# Patient Record
Sex: Female | Born: 1940
Health system: Southern US, Community
[De-identification: ages and names within clinical notes are randomized; demographics above are authoritative.]

## PROBLEM LIST (undated history)

## (undated) DIAGNOSIS — Z789 Other specified health status: Secondary | ICD-10-CM

## (undated) DIAGNOSIS — C4490 Unspecified malignant neoplasm of skin, unspecified: Secondary | ICD-10-CM

## (undated) DIAGNOSIS — J869 Pyothorax without fistula: Secondary | ICD-10-CM

## (undated) HISTORY — PX: NO PAST SURGERIES: SHX2092

## (undated) HISTORY — PX: BRONCHOSCOPY: SUR163

---

## 2003-06-26 ENCOUNTER — Inpatient Hospital Stay (HOSPITAL_COMMUNITY): Admission: EM | Admit: 2003-06-26 | Discharge: 2003-06-30 | Payer: Self-pay | Admitting: *Deleted

## 2003-09-02 ENCOUNTER — Encounter: Admission: RE | Admit: 2003-09-02 | Discharge: 2003-10-22 | Payer: Self-pay | Admitting: Orthopedic Surgery

## 2004-07-31 DIAGNOSIS — J869 Pyothorax without fistula: Secondary | ICD-10-CM

## 2004-07-31 HISTORY — DX: Pyothorax without fistula: J86.9

## 2004-07-31 HISTORY — PX: CHEST TUBE INSERTION: SHX231

## 2004-11-01 ENCOUNTER — Emergency Department (HOSPITAL_COMMUNITY): Admission: EM | Admit: 2004-11-01 | Discharge: 2004-11-01 | Payer: Self-pay | Admitting: Emergency Medicine

## 2005-06-21 ENCOUNTER — Inpatient Hospital Stay (HOSPITAL_COMMUNITY): Admission: EM | Admit: 2005-06-21 | Discharge: 2005-07-27 | Payer: Self-pay | Admitting: Emergency Medicine

## 2005-06-21 ENCOUNTER — Ambulatory Visit: Payer: Self-pay | Admitting: Internal Medicine

## 2005-06-21 ENCOUNTER — Ambulatory Visit: Payer: Self-pay | Admitting: Physical Medicine & Rehabilitation

## 2005-06-23 ENCOUNTER — Ambulatory Visit: Payer: Self-pay | Admitting: Pulmonary Disease

## 2005-07-02 ENCOUNTER — Ambulatory Visit: Payer: Self-pay | Admitting: Pulmonary Disease

## 2005-07-27 ENCOUNTER — Inpatient Hospital Stay
Admission: RE | Admit: 2005-07-27 | Discharge: 2005-08-04 | Payer: Self-pay | Admitting: Physical Medicine & Rehabilitation

## 2005-07-27 ENCOUNTER — Ambulatory Visit: Payer: Self-pay | Admitting: Physical Medicine & Rehabilitation

## 2005-08-16 ENCOUNTER — Encounter: Admission: RE | Admit: 2005-08-16 | Discharge: 2005-08-16 | Payer: Self-pay | Admitting: Thoracic Surgery

## 2005-08-29 ENCOUNTER — Ambulatory Visit: Payer: Self-pay | Admitting: Internal Medicine

## 2005-09-06 ENCOUNTER — Encounter: Admission: RE | Admit: 2005-09-06 | Discharge: 2005-09-06 | Payer: Self-pay | Admitting: Thoracic Surgery

## 2005-10-23 ENCOUNTER — Ambulatory Visit: Payer: Self-pay | Admitting: Internal Medicine

## 2005-11-22 ENCOUNTER — Encounter: Admission: RE | Admit: 2005-11-22 | Discharge: 2005-11-22 | Payer: Self-pay | Admitting: Thoracic Surgery

## 2006-01-18 IMAGING — CR DG CHEST 2V
3 series · 3 of 3 positions shown · non-contrast
Comparison: none

CLINICAL DATA: Pneumonia

[view not recorded (1 of 3)]
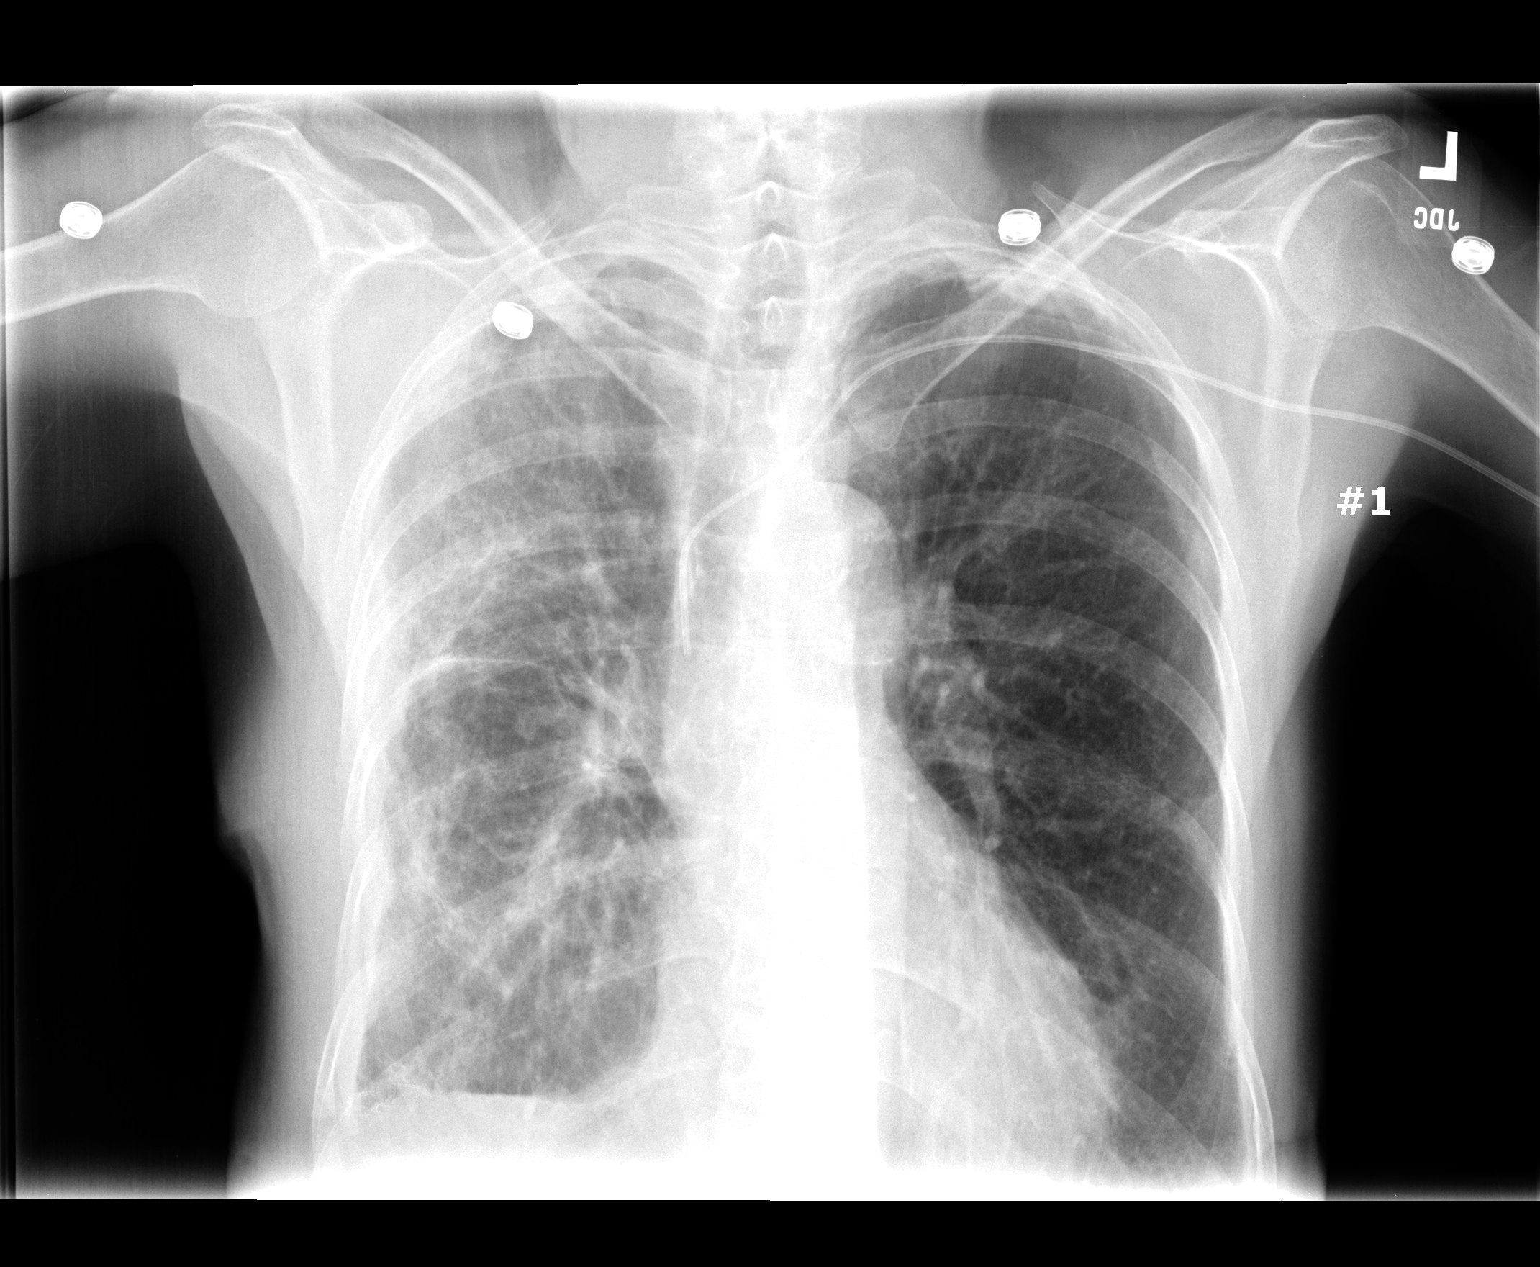

[view not recorded (2 of 3)]
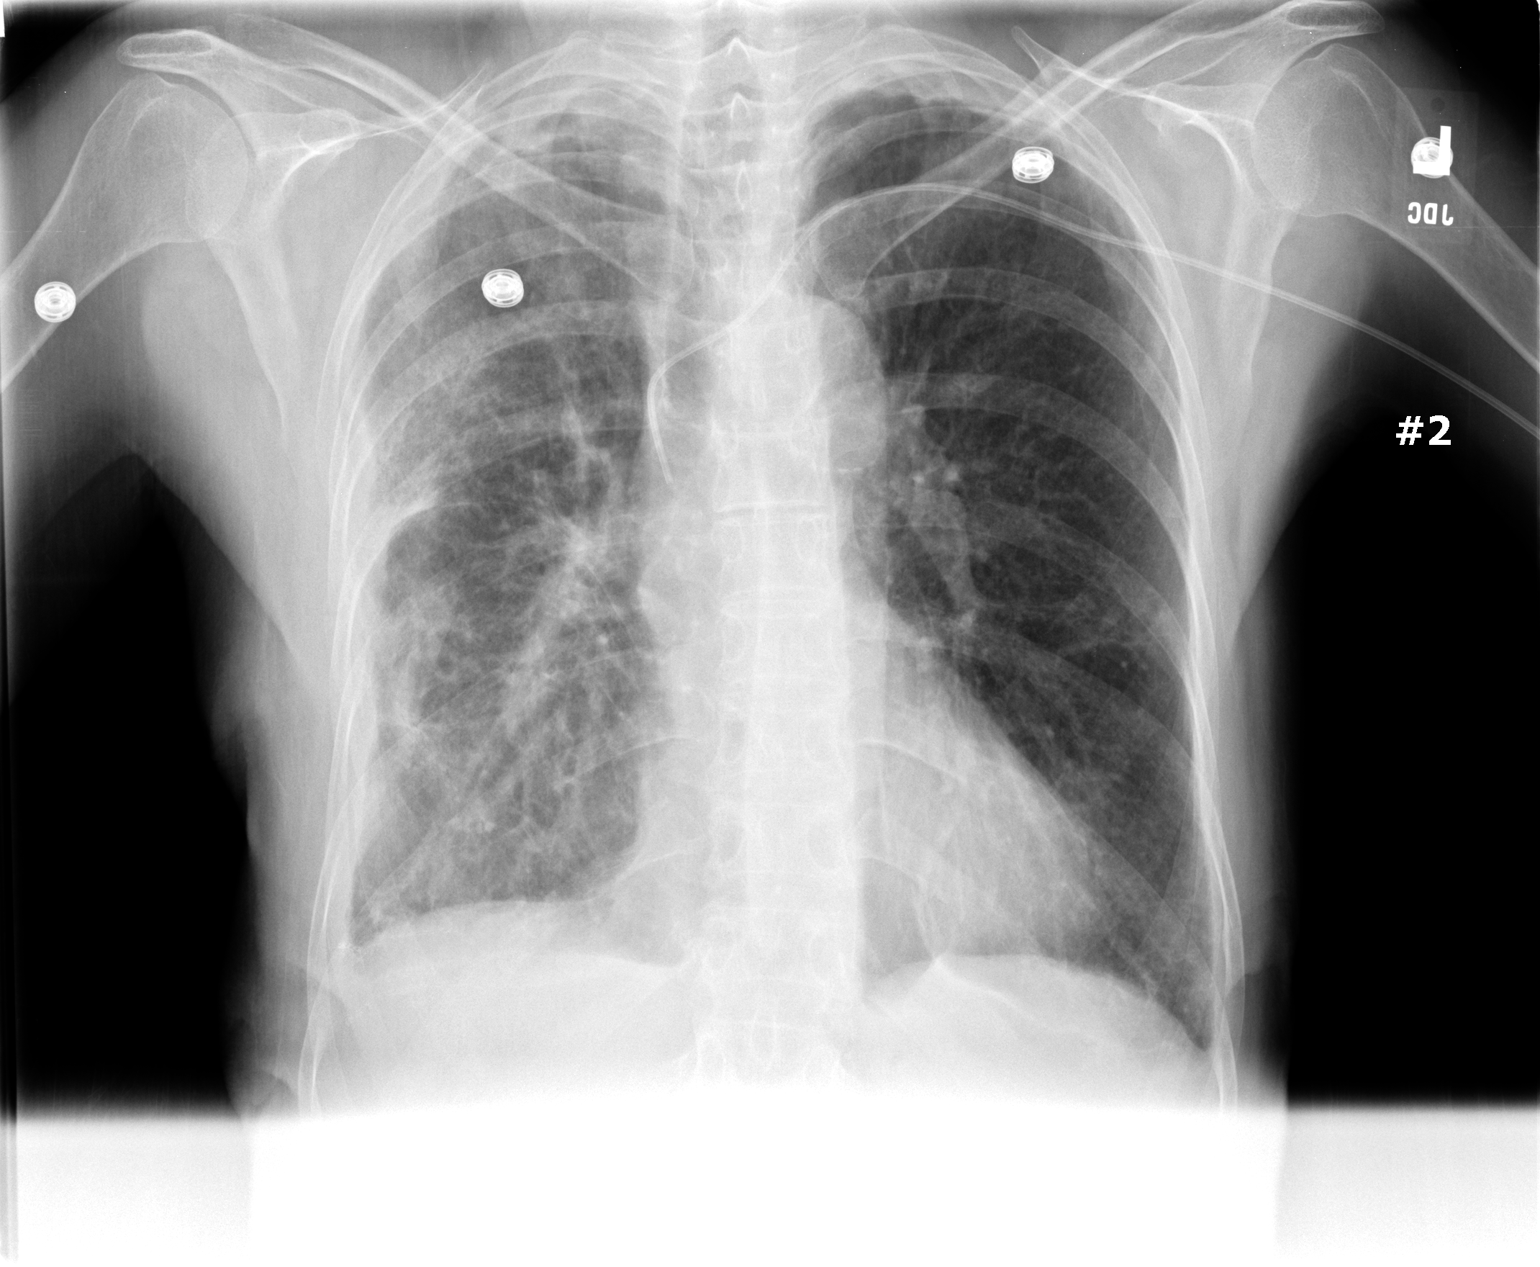

[view not recorded (3 of 3)]
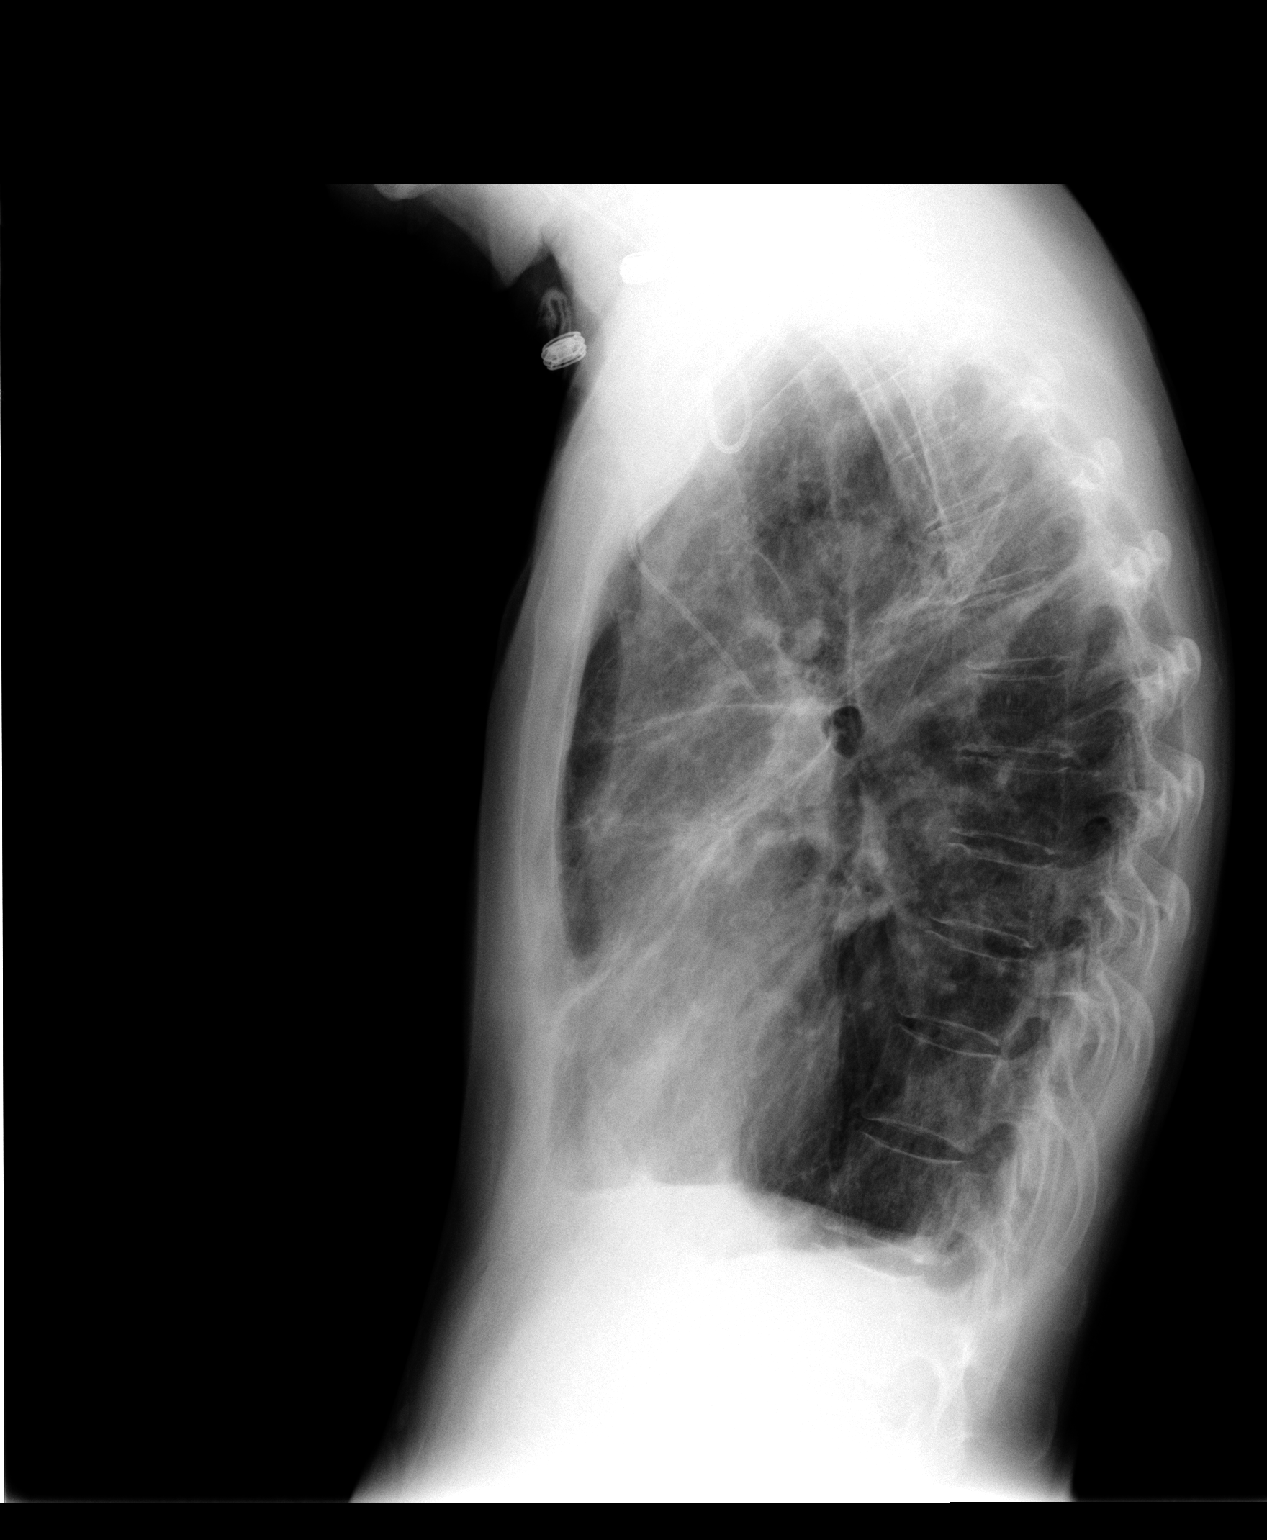

[3 of 3 positions shown; findings below may reference images not displayed]

Chest 2 view:

Comparison 07/26/2005. Right arm PICC line to SVC as before. Some marginal
increase in coarse air space opacities in the right upper lobe and right middle
lobe. Left lung hyperinflated with coarse bibasilar interstitial markings
stable. No definite effusion. No pneumothorax is evident. Heart size upper
limits normal.
IMPRESSION: 1. Marginal worsening of coarse  right upper and middle lobe infiltrates.

## 2006-02-03 IMAGING — CR DG CHEST 2V
2 series · 2 of 2 positions shown · non-contrast
Comparison: 07/31/05.

CLINICAL DATA: Post right lung surgery.
 CHEST, TWO VIEWS:

[view not recorded (1 of 2)]
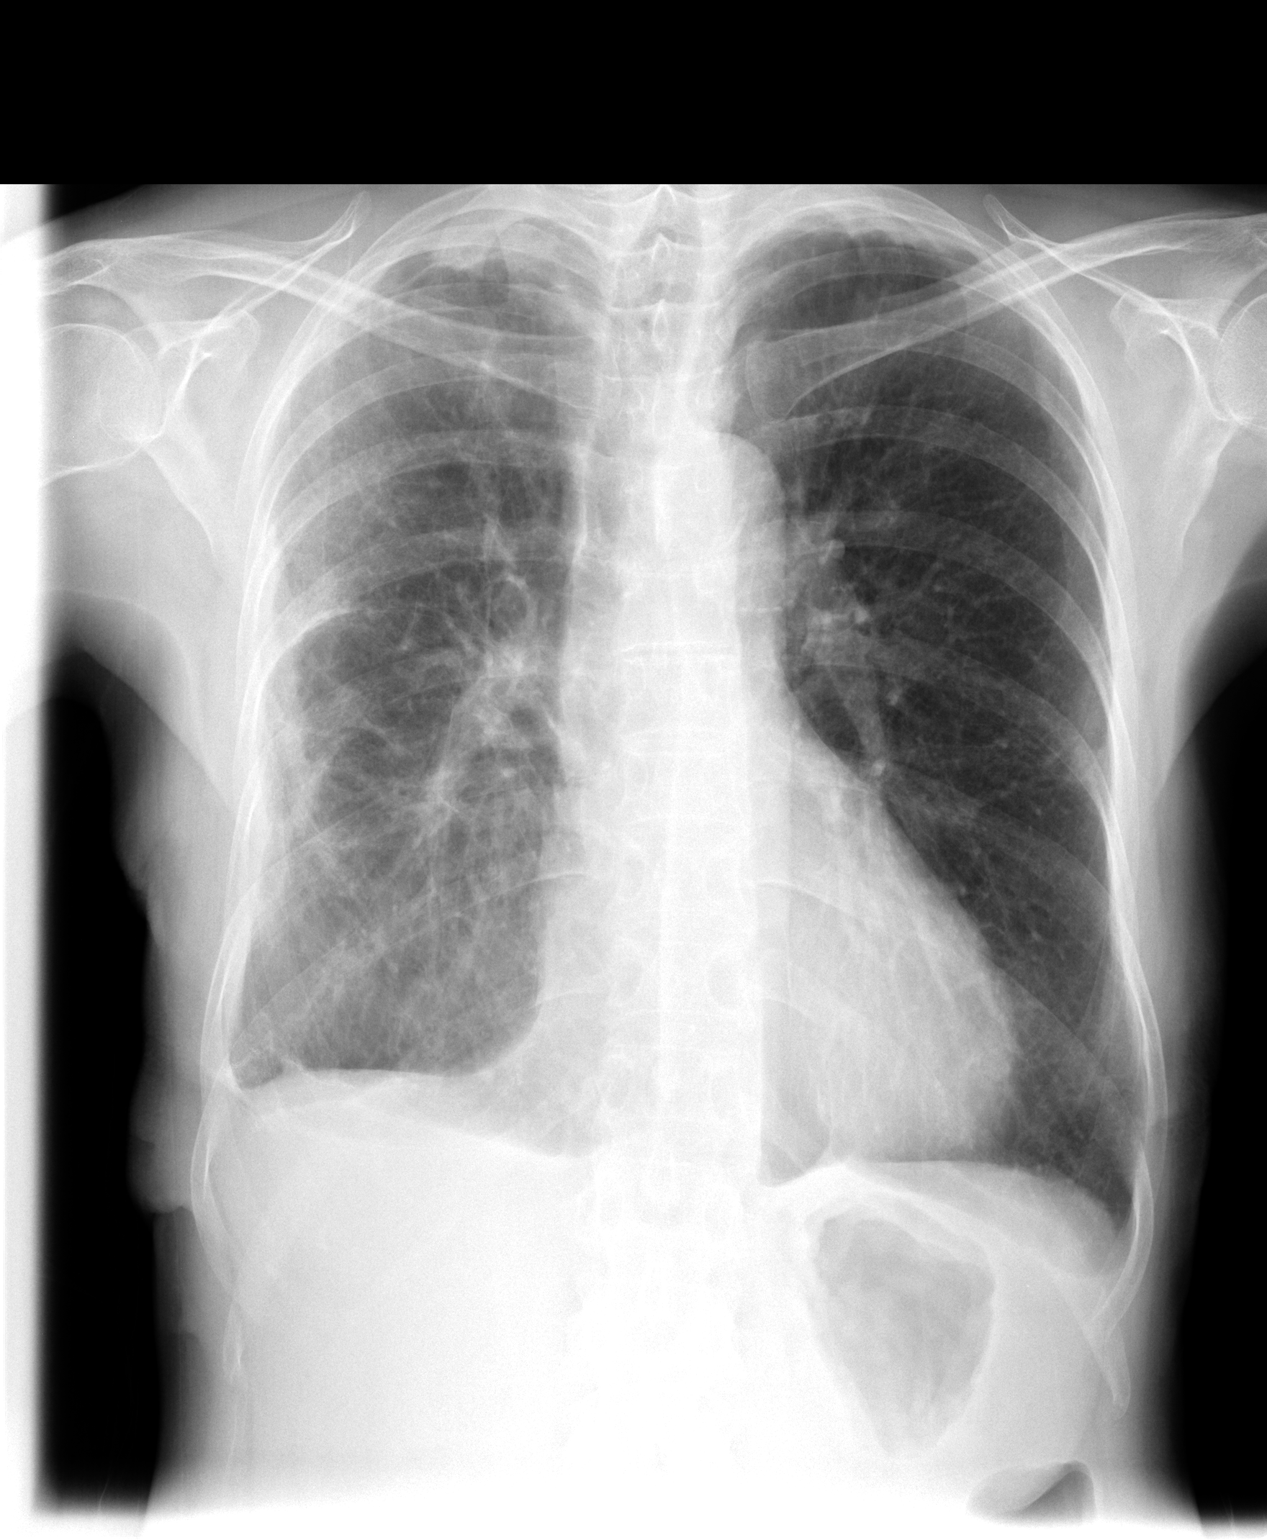

[view not recorded (2 of 2)]
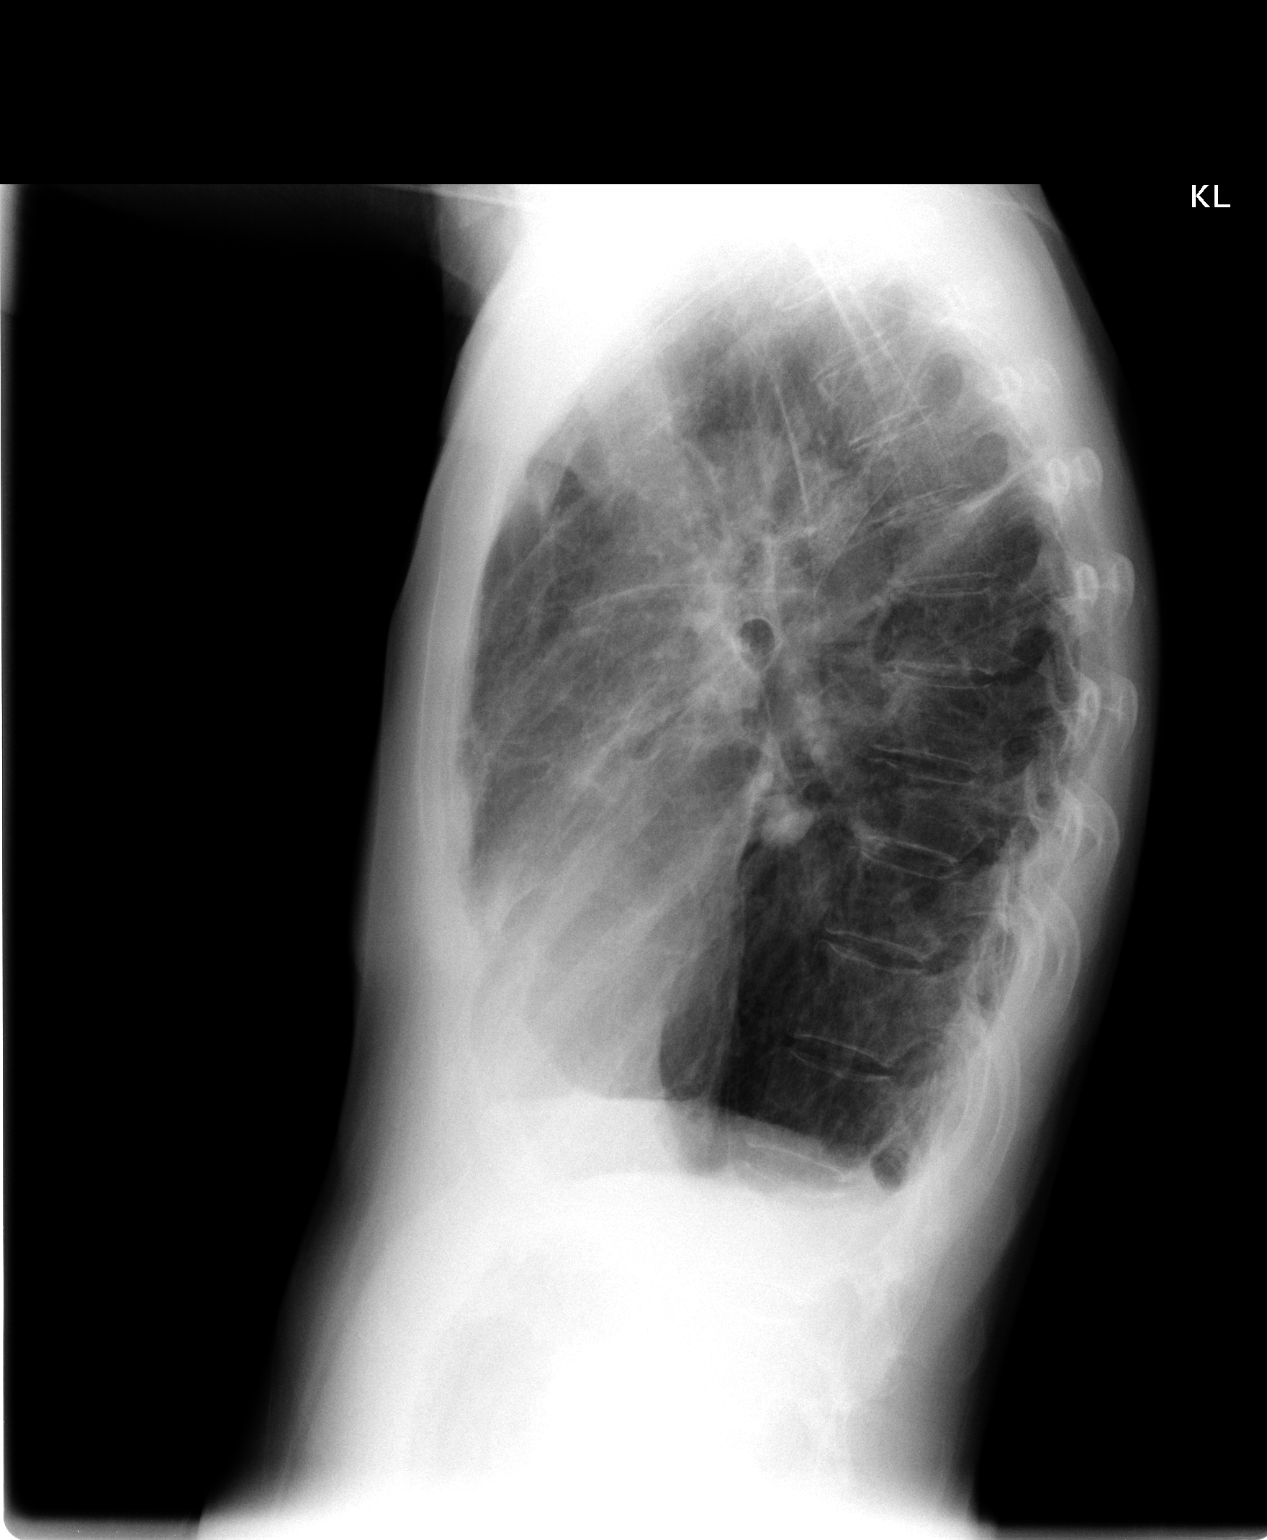

[2 of 2 positions shown; findings below may reference images not displayed]

Right lung aeration improved.  There is still some residual density which may well be chronic.  COPD with small pleural effusions or pleural scarring and rather heavy apical pleural scarring.  Left upper extremity PICC has been removed.
IMPRESSION: 1.  Right lung infiltrates, improved significantly.
 2.  COPD.

## 2006-05-01 DIAGNOSIS — I10 Essential (primary) hypertension: Secondary | ICD-10-CM | POA: Insufficient documentation

## 2006-07-17 DIAGNOSIS — J869 Pyothorax without fistula: Secondary | ICD-10-CM | POA: Insufficient documentation

## 2010-08-21 ENCOUNTER — Encounter: Payer: Self-pay | Admitting: Thoracic Surgery

## 2011-11-06 ENCOUNTER — Encounter (HOSPITAL_COMMUNITY): Payer: Self-pay | Admitting: Emergency Medicine

## 2011-11-06 ENCOUNTER — Emergency Department (HOSPITAL_COMMUNITY): Payer: Medicare PPO

## 2011-11-06 ENCOUNTER — Emergency Department (HOSPITAL_COMMUNITY)
Admission: EM | Admit: 2011-11-06 | Discharge: 2011-11-06 | Disposition: A | Discharge status: Home / Self Care | Payer: Medicare PPO | Attending: Emergency Medicine | Admitting: Emergency Medicine

## 2011-11-06 DIAGNOSIS — N979 Female infertility, unspecified: Secondary | ICD-10-CM | POA: Insufficient documentation

## 2011-11-06 DIAGNOSIS — Z808 Family history of malignant neoplasm of other organs or systems: Secondary | ICD-10-CM | POA: Insufficient documentation

## 2011-11-06 DIAGNOSIS — R079 Chest pain, unspecified: Secondary | ICD-10-CM | POA: Insufficient documentation

## 2011-11-06 DIAGNOSIS — L989 Disorder of the skin and subcutaneous tissue, unspecified: Secondary | ICD-10-CM | POA: Insufficient documentation

## 2011-11-06 DIAGNOSIS — R059 Cough, unspecified: Secondary | ICD-10-CM | POA: Insufficient documentation

## 2011-11-06 DIAGNOSIS — L0390 Cellulitis, unspecified: Secondary | ICD-10-CM

## 2011-11-06 DIAGNOSIS — R05 Cough: Secondary | ICD-10-CM | POA: Insufficient documentation

## 2011-11-06 LAB — COMPREHENSIVE METABOLIC PANEL
ALT: 14 U/L (ref 0–35)
AST: 20 U/L (ref 0–37)
Albumin: 4.2 g/dL (ref 3.5–5.2)
Alkaline Phosphatase: 93 U/L (ref 39–117)
BUN: 9 mg/dL (ref 6–23)
CO2: 28 mEq/L (ref 19–32)
Calcium: 9.7 mg/dL (ref 8.4–10.5)
Creatinine, Ser: 0.7 mg/dL (ref 0.50–1.10)
GFR calc Af Amer: >90 mL/min (ref >90)
GFR calc non Af Amer: 85 mL/min — ABNORMAL LOW (ref >90)
Potassium: 3.5 mEq/L (ref 3.5–5.1)
Sodium: 139 mEq/L (ref 135–145)
Total Bilirubin: 0.4 mg/dL (ref 0.3–1.2)

## 2011-11-06 LAB — CBC
HCT: 43.8 % (ref 36.0–46.0)
Hemoglobin: 14.3 g/dL (ref 12.0–15.0)
MCH: 30.8 pg (ref 26.0–34.0)
MCHC: 32.6 g/dL (ref 30.0–36.0)
MCV: 94.2 fL (ref 78.0–100.0)
Platelets: 238 10*3/uL (ref 150–400)
RBC: 4.65 MIL/uL (ref 3.87–5.11)
WBC: 8.2 10*3/uL (ref 4.0–10.5)

## 2011-11-06 LAB — DIFFERENTIAL
Basophils Absolute: 0 10*3/uL (ref 0.0–0.1)
Eosinophils Absolute: 0.1 10*3/uL (ref 0.0–0.7)
Eosinophils Relative: 1 % (ref 0–5)
Lymphocytes Relative: 19 % (ref 12–46)
Lymphs Abs: 1.6 10*3/uL (ref 0.7–4.0)
Monocytes Absolute: 0.5 10*3/uL (ref 0.1–1.0)
Monocytes Relative: 6 % (ref 3–12)
Neutro Abs: 6.1 10*3/uL (ref 1.7–7.7)
Neutrophils Relative %: 74 % (ref 43–77)

## 2011-11-06 MED ORDER — IOHEXOL 300 MG/ML  SOLN
80.0000 mL | Freq: Once | INTRAMUSCULAR | Status: AC | PRN
  Administered 2011-11-06: 80 mL via INTRAVENOUS

## 2011-11-06 MED ORDER — SODIUM CHLORIDE 0.9 % IV SOLN: Freq: Once | INTRAVENOUS | Status: DC

## 2011-11-06 MED ORDER — DOXYCYCLINE HYCLATE 100 MG PO CAPS: 100.0000 mg | ORAL_CAPSULE | Freq: Two times a day (BID) | ORAL | Status: AC

## 2011-11-06 MED ORDER — DIPHENHYDRAMINE HCL 25 MG PO CAPS
25.0000 mg | ORAL_CAPSULE | Freq: Once | ORAL | Status: AC
  Administered 2011-11-06: 25 mg via ORAL
  Filled 2011-11-06: qty 1

## 2011-11-06 MED ORDER — BACITRACIN ZINC 500 UNIT/GM EX OINT
TOPICAL_OINTMENT | CUTANEOUS | Status: AC
  Filled 2011-11-06: qty 1.8

## 2011-11-06 NOTE — ED Notes (Signed)
Two sisters and mother have hx of skin cancer

## 2011-11-06 NOTE — ED Notes (Signed)
To ed via private vehicle with c/o two open wounds that she has had for months, not good historian. Has not seen a doctor since 2006.

## 2011-11-06 NOTE — Discharge Instructions (Signed)
Call dr. Elease Etienne office today to schedule a follow up appointment--you may have skin cancer and its very important that you are seen by him or a dermatologist  Cellulitis Cellulitis is an infection of the skin and the tissue beneath it. The area is typically red and tender. It is caused by germs (bacteria) (usually staph or strep) that enter the body through cuts or sores. Cellulitis most commonly occurs in the arms or lower legs.  HOME CARE INSTRUCTIONS   If you are given a prescription for medications which kill germs (antibiotics), take as directed until finished.   If the infection is on the arm or leg, keep the limb elevated as able.   Use a warm cloth several times per day to relieve pain and encourage healing.   See your caregiver for recheck of the infected site as directed if problems arise.   Only take over-the-counter or prescription medicines for pain, discomfort, or fever as directed by your caregiver.  SEEK MEDICAL CARE IF:   The area of redness (inflammation) is spreading, there are red streaks coming from the infected site, or if a part of the infection begins to turn dark in color.   The joint or bone underneath the infected skin becomes painful after the skin has healed.   The infection returns in the same or another area after it seems to have gone away.   A boil or bump swells up. This may be an abscess.   New, unexplained problems such as pain or fever develop.  SEEK IMMEDIATE MEDICAL CARE IF:   You have a fever.   You or your child feels drowsy or lethargic.   There is vomiting, diarrhea, or lasting discomfort or feeling ill (malaise) with muscle aches and pains.  MAKE SURE YOU:   Understand these instructions.   Will watch your condition.   Will get help right away if you are not doing well or get worse.  Document Released: 04/26/2005 Document Revised: 07/06/2011 Document Reviewed: 03/04/2008 [facility] Patient Information 2012 [place], [place].

## 2011-11-06 NOTE — ED Provider Notes (Signed)
History     CSN: [id number]  Arrival date & time 11/06/11  0905   First MD Initiated Contact with Patient 11/06/11 [phone]      Chief Complaint  Patient presents with  . possible skin cancer     (Consider location/radiation/quality/duration/timing/severity/associated sxs/prior treatment) The history is provided by the patient and a relative.   Patient here with skin lesion to right arm and right posterior thoracic area. Lesions have been there for an unknown amount of time possibly at least 6 months. Denies any fever or chills. Patient notes that she does expose himself to the sun quite often. Denies any history of skin cancer. No recent history of weight loss or night sweats. No medications taken for this prior to arrival. Has not seen a doctor for this No past medical history on file.  No past surgical history on file.  No family history on file.  History  Substance Use Topics  . Smoking status: Not on file  . Smokeless tobacco: Not on file  . Alcohol Use: Not on file    OB History    No data available      Review of Systems  All other systems reviewed and are negative.    Allergies  Review of patient's allergies indicates not on file.  Home Medications  No current outpatient prescriptions on file.  BP 161/72  Pulse 72  Temp(Src) 97.8 F (36.6 C) (Oral)  Resp 16  Wt 103 lb (46.72 kg)  SpO2 99%  Physical Exam  Nursing note and vitals reviewed. Constitutional: She is oriented to person, place, and time. She appears well-developed and well-nourished.  Non-toxic appearance. No distress.  HENT:  Head: Normocephalic and atraumatic.  Eyes: Conjunctivae, EOM and lids are normal. Pupils are equal, round, and reactive to light.  Neck: Normal range of motion. Neck supple. No tracheal deviation present. No mass present.  Cardiovascular: Normal rate, regular rhythm and normal heart sounds.  Exam reveals no gallop.   No murmur heard. Pulmonary/Chest: Effort normal and  breath sounds normal. No stridor. No respiratory distress. She has no decreased breath sounds. She has no wheezes. She has no rhonchi. She has no rales.  Abdominal: Soft. Normal appearance and bowel sounds are normal. She exhibits no distension. There is no tenderness. There is no rebound and no CVA tenderness.  Musculoskeletal: Normal range of motion. She exhibits no edema and no tenderness.  Neurological: She is alert and oriented to person, place, and time. She has normal strength. No cranial nerve deficit or sensory deficit. GCS eye subscore is 4. GCS verbal subscore is 5. GCS motor subscore is 6.  Skin: Skin is warm and dry. Lesion noted.       Right arm with medium sized raised lesion with curled edges and central ulceration slight erythema  Right posterior thoracic area: medium sized lesion on back with central ulceration  Psychiatric: She has a normal mood and affect. Her speech is normal and behavior is normal.    ED Course  Procedures (including critical care time)   Labs Reviewed  COMPREHENSIVE METABOLIC PANEL  CBC  DIFFERENTIAL   No results found.   No diagnosis found.    MDM  I attempted to contact Dr. Terri Piedra, dermatology, but he was unavailable. Patient will be treated for secondary cellulitis. The importance of close followup with a dermatologist was explained to her as she has a family history of skin cancer with 2 sisters. She understands this and will followup with Dr. Terri Piedra  Toy Baker, MD 11/06/11 1245

## 2011-11-14 ENCOUNTER — Other Ambulatory Visit: Payer: Self-pay | Admitting: Surgery

## 2012-01-22 ENCOUNTER — Encounter (HOSPITAL_BASED_OUTPATIENT_CLINIC_OR_DEPARTMENT_OTHER): Payer: Medicare PPO | Attending: General Surgery

## 2012-01-22 DIAGNOSIS — Y838 Other surgical procedures as the cause of abnormal reaction of the patient, or of later complication, without mention of misadventure at the time of the procedure: Secondary | ICD-10-CM | POA: Insufficient documentation

## 2012-01-22 DIAGNOSIS — S21209A Unspecified open wound of unspecified back wall of thorax without penetration into thoracic cavity, initial encounter: Secondary | ICD-10-CM | POA: Insufficient documentation

## 2012-01-22 DIAGNOSIS — C768 Malignant neoplasm of other specified ill-defined sites: Secondary | ICD-10-CM | POA: Insufficient documentation

## 2012-01-22 NOTE — Progress Notes (Signed)
[facility]  NAME:  Pamela Owens, Pamela Owens                 ACCOUNT NO.:  [id number]  MEDICAL RECORD NO.:  [id number]      DATE OF BIRTH:  1940-10-16  PHYSICIAN:  Wayland Denis, DO       VISIT DATE:  01/22/2012                                  OFFICE VISIT   CHIEF COMPLAINT:  Wound on the back.  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  She underwent excision by Mohs technique with negative margins obtained. She now has a new wound, that is 12.5 x 6.4 x 2 cm in size on the right side of her back.  A portion of it was closed by the Mohs surgeon.  She has a history of skin cancer and has another one on her arm that is going to be excised in the next few days.  She has been using wet-to-dry dressings on the area.  No significant change has been noted over the last several days.  Her primary caregiver is her son.  He seems very interactive with her and competent to do her dressing changes.  She had been dealing with this area for many years and did not seek help until recently.  REVIEW OF SYSTEMS:  Otherwise negative.  MEDICATIONS:  Vicodin for pain.  ALLERGIES:  No known drug allergies.  MEDICAL HISTORY:  She has no chronic medical conditions, and denies any diabetes, high blood pressure, or high cholesterol.  SURGERY:  Skin cancer excision of a basaloid squamous-cell on her back.  SOCIAL HISTORY:  The patient lives at home.  She used to smoke, but no longer does, and she is a Fitzpatrick 1.  PHYSICAL EXAMINATION:  GENERAL:  She is alert, oriented, cooperative, not in any acute distress.  She is pleasant. HEENT:  Pupils are equal.  Extraocular muscles are intact. NECK:  No cervical lymphadenopathy. LUNGS:  Breathing is unlabored. HEART:  Regular. ABDOMEN:  Soft. SKIN:  She has a gauze over her right shoulder upper arm area with an obvious skin cancer that will be  excised this week and on the back as described above.  PLAN:  Placing collagen on the area now and we will get her worked up for the operating room for irrigation and debridement, ACell placement in the back.  She and her son are in agreement for this treatment plan, which would then be followed by a skin graft once the area is granulating well.     Wayland Denis, DO     CS/MEDQ  D:  01/22/2012  T:  01/22/2012  Job:  [id number]

## 2012-01-29 ENCOUNTER — Encounter (HOSPITAL_BASED_OUTPATIENT_CLINIC_OR_DEPARTMENT_OTHER): Payer: Medicare PPO | Attending: General Surgery

## 2012-01-29 DIAGNOSIS — L98499 Non-pressure chronic ulcer of skin of other sites with unspecified severity: Secondary | ICD-10-CM | POA: Insufficient documentation

## 2012-01-30 NOTE — Progress Notes (Signed)
[facility]  NAME:  Pamela Owens, Pamela Owens                 ACCOUNT NO.:  [id number]  MEDICAL RECORD NO.:  [id number]      DATE OF BIRTH:  [birth year]  PHYSICIAN:  Wayland Denis, DO       VISIT DATE:  01/29/2012                                  OFFICE VISIT   The patient is a 71 year old female here with her son for followup on her back ulcer secondary to excision of a skin cancer.  She also has stitches in her right arm that were placed after excision of a skin cancer by the Mohs surgeon.  The arm looks quite a bit red and swollen. The back is actually improving with the collagen.  The back was a basal cell carcinoma that was excised.  Her medications are unchanged and her social history is unchanged.  She still has good support from her son.  On exam, she is alert, pleasant, oriented, and cooperative, not in any acute distress.  Pupils are equal.  Extraocular muscles are intact.  No cervical lymphadenopathy.  Her breathing is unlabored.  Her heart is regular.  Her abdomen is  soft.  The wound is slightly improved in the back, starting to fill and no sign of infection or drainage.  We will give her doxycycline and schedule her for surgery for A-Cell and VAC placement and then hopefully will be able to follow up with a skin graft.     Wayland Denis, DO     CS/MEDQ  D:  01/29/2012  T:  01/30/2012  Job:  [id number]

## 2012-02-05 ENCOUNTER — Other Ambulatory Visit: Payer: Self-pay | Admitting: Plastic Surgery

## 2012-02-05 ENCOUNTER — Encounter (HOSPITAL_BASED_OUTPATIENT_CLINIC_OR_DEPARTMENT_OTHER): Payer: Self-pay | Admitting: *Deleted

## 2012-02-05 DIAGNOSIS — L98429 Non-pressure chronic ulcer of back with unspecified severity: Secondary | ICD-10-CM

## 2012-02-05 NOTE — Progress Notes (Signed)
Pt says she is healthy-does not have a dr-had skin cancers removed-saw dr Kelly Splinter- Talked with son-did preop teaching with him at pts request. No labs needed

## 2012-02-05 NOTE — Progress Notes (Signed)
[facility]  NAME:  Pamela Owens, Pamela Owens                 ACCOUNT NO.:  [id number]  MEDICAL RECORD NO.:  [id number]      DATE OF BIRTH:  21-Nov-1940  PHYSICIAN:  Wayland Denis, DO       VISIT DATE:  02/05/2012                                  OFFICE VISIT   The patient is a 71 year old female who is here for followup on her back ulcer that she has after excision of skin cancer.  We have been using collagen on the area, it staying basically the same, it does not appear to be infected.  No change in her medications or social history.  She is still taking the doxycycline  On exam, she is alert, oriented, and cooperative, not in any acute distress.  She is pleasant.  Pupils are equal.  Extraocular muscles are intact.  No cervical lymphadenopathy.  Her breathing is unlabored. Heart is regular.  The wound is as described above.  Continue with the collagen and we will plan on putting A-Cell on there in the OR this week and we will see her back in 1 week.     Wayland Denis, DO     CS/MEDQ  D:  02/05/2012  T:  02/05/2012  Job:  [id number]

## 2012-02-07 ENCOUNTER — Ambulatory Visit (HOSPITAL_BASED_OUTPATIENT_CLINIC_OR_DEPARTMENT_OTHER)
Admission: RE | Admit: 2012-02-07 | Discharge: 2012-02-07 | Disposition: A | Discharge status: Home / Self Care | Payer: Medicare PPO | Source: Ambulatory Visit | Attending: Plastic Surgery | Admitting: Plastic Surgery

## 2012-02-07 ENCOUNTER — Encounter (HOSPITAL_BASED_OUTPATIENT_CLINIC_OR_DEPARTMENT_OTHER): Payer: Self-pay | Admitting: Anesthesiology

## 2012-02-07 ENCOUNTER — Ambulatory Visit (HOSPITAL_BASED_OUTPATIENT_CLINIC_OR_DEPARTMENT_OTHER): Payer: Medicare PPO | Admitting: Anesthesiology

## 2012-02-07 ENCOUNTER — Encounter (HOSPITAL_BASED_OUTPATIENT_CLINIC_OR_DEPARTMENT_OTHER)
Admission: RE | Disposition: A | Discharge status: Home / Self Care | Payer: Self-pay | Source: Ambulatory Visit | Attending: Plastic Surgery

## 2012-02-07 ENCOUNTER — Encounter (HOSPITAL_BASED_OUTPATIENT_CLINIC_OR_DEPARTMENT_OTHER): Payer: Self-pay | Admitting: Plastic Surgery

## 2012-02-07 DIAGNOSIS — Z85828 Personal history of other malignant neoplasm of skin: Secondary | ICD-10-CM | POA: Insufficient documentation

## 2012-02-07 DIAGNOSIS — L98429 Non-pressure chronic ulcer of back with unspecified severity: Secondary | ICD-10-CM | POA: Diagnosis present

## 2012-02-07 DIAGNOSIS — Z428 Encounter for other plastic and reconstructive surgery following medical procedure or healed injury: Secondary | ICD-10-CM | POA: Insufficient documentation

## 2012-02-07 HISTORY — DX: Unspecified malignant neoplasm of skin, unspecified: C44.90

## 2012-02-07 HISTORY — PX: INCISION AND DRAINAGE OF WOUND: SHX1803

## 2012-02-07 HISTORY — DX: Other specified health status: Z78.9

## 2012-02-07 HISTORY — DX: Pyothorax without fistula: J86.9

## 2012-02-07 SURGERY — IRRIGATION AND DEBRIDEMENT WOUND
Anesthesia: General | Site: Back | Wound class: Clean

## 2012-02-07 MED ORDER — EPHEDRINE SULFATE 50 MG/ML IJ SOLN
INTRAMUSCULAR | Status: DC | PRN
  Administered 2012-02-07 (×2): 10 mg via INTRAVENOUS

## 2012-02-07 MED ORDER — PROPOFOL 10 MG/ML IV EMUL
INTRAVENOUS | Status: DC | PRN
  Administered 2012-02-07: 100 mg via INTRAVENOUS

## 2012-02-07 MED ORDER — LIDOCAINE HCL (CARDIAC) 20 MG/ML IV SOLN
INTRAVENOUS | Status: DC | PRN
  Administered 2012-02-07: 60 mg via INTRAVENOUS
  Administered 2012-02-07: 6 mg via INTRAVENOUS

## 2012-02-07 MED ORDER — MIDAZOLAM HCL 5 MG/5ML IJ SOLN
INTRAMUSCULAR | Status: DC | PRN
  Administered 2012-02-07: 1 mg via INTRAVENOUS

## 2012-02-07 MED ORDER — ONDANSETRON HCL 4 MG/2ML IJ SOLN
INTRAMUSCULAR | Status: DC | PRN
  Administered 2012-02-07: 4 mg via INTRAVENOUS

## 2012-02-07 MED ORDER — LACTATED RINGERS IV SOLN
INTRAVENOUS | Status: DC
  Administered 2012-02-07: 12:00:00 via INTRAVENOUS

## 2012-02-07 MED ORDER — FENTANYL CITRATE 0.05 MG/ML IJ SOLN
INTRAMUSCULAR | Status: DC | PRN
  Administered 2012-02-07 (×2): 50 ug via INTRAVENOUS

## 2012-02-07 MED ORDER — CEFAZOLIN SODIUM-DEXTROSE 2-3 GM-% IV SOLR
2.0000 g | INTRAVENOUS | Status: AC
  Administered 2012-02-07: 2 g via INTRAVENOUS

## 2012-02-07 SURGICAL SUPPLY — 62 items
APL SKNCLS STERI-STRIP NONHPOA (GAUZE/BANDAGES/DRESSINGS)
BAG DECANTER FOR FLEXI CONT (MISCELLANEOUS) IMPLANT
BENZOIN TINCTURE PRP APPL 2/3 (GAUZE/BANDAGES/DRESSINGS) IMPLANT
BLADE HEX COATED 2.75 (ELECTRODE) ×1 IMPLANT
BLADE SURG 10 STRL SS (BLADE) ×1 IMPLANT
BLADE SURG 15 STRL LF DISP TIS (BLADE) ×1 IMPLANT
BLADE SURG 15 STRL SS (BLADE) ×2
CANISTER OMNI JUG 16 LITER (MISCELLANEOUS) ×1 IMPLANT
CANISTER SUCTION 1200CC (MISCELLANEOUS) ×2 IMPLANT
CANISTER SUCTION 2500CC (MISCELLANEOUS) IMPLANT
CHLORAPREP W/TINT 26ML (MISCELLANEOUS) IMPLANT
CLOTH BEACON ORANGE TIMEOUT ST (SAFETY) ×2 IMPLANT
COVER MAYO STAND STRL (DRAPES) ×2 IMPLANT
COVER TABLE BACK 60X90 (DRAPES) ×2 IMPLANT
DECANTER SPIKE VIAL GLASS SM (MISCELLANEOUS) IMPLANT
DRAIN PENROSE 1/2X12 LTX STRL (WOUND CARE) IMPLANT
DRAPE EXTREMITY T 121X128X90 (DRAPE) ×1 IMPLANT
DRAPE INCISE IOBAN 66X45 STRL (DRAPES) ×1 IMPLANT
DRAPE LAPAROSCOPIC ABDOMINAL (DRAPES) IMPLANT
DRAPE PED LAPAROTOMY (DRAPES) ×1 IMPLANT
DRSG ADAPTIC 3X8 NADH LF (GAUZE/BANDAGES/DRESSINGS) IMPLANT
DRSG EMULSION OIL 3X3 NADH (GAUZE/BANDAGES/DRESSINGS) IMPLANT
DRSG PAD ABDOMINAL 8X10 ST (GAUZE/BANDAGES/DRESSINGS) ×1 IMPLANT
ELECT REM PT RETURN 9FT ADLT (ELECTROSURGICAL) ×2
ELECTRODE REM PT RTRN 9FT ADLT (ELECTROSURGICAL) ×1 IMPLANT
GAUZE SPONGE 4X4 12PLY STRL LF (GAUZE/BANDAGES/DRESSINGS) IMPLANT
GAUZE XEROFORM 1X8 LF (GAUZE/BANDAGES/DRESSINGS) IMPLANT
GAUZE XEROFORM 5X9 LF (GAUZE/BANDAGES/DRESSINGS) IMPLANT
GLOVE BIO SURGEON STRL SZ 6.5 (GLOVE) ×4 IMPLANT
GOWN PREVENTION PLUS XLARGE (GOWN DISPOSABLE) ×4 IMPLANT
HANDPIECE INTERPULSE COAX TIP (DISPOSABLE)
IV NS IRRIG 3000ML ARTHROMATIC (IV SOLUTION) IMPLANT
MATRIX SURGICAL PSM 7X10CM (Tissue) ×1 IMPLANT
MICROMATRIX 500MG (Tissue) ×2 IMPLANT
NEEDLE 27GAX1X1/2 (NEEDLE) IMPLANT
NS IRRIG 1000ML POUR BTL (IV SOLUTION) ×2 IMPLANT
PACK BASIN DAY SURGERY FS (CUSTOM PROCEDURE TRAY) ×2 IMPLANT
PENCIL BUTTON HOLSTER BLD 10FT (ELECTRODE) ×2 IMPLANT
SET HNDPC FAN SPRY TIP SCT (DISPOSABLE) IMPLANT
SHEET MEDIUM DRAPE 40X70 STRL (DRAPES) IMPLANT
SLEEVE SCD COMPRESS KNEE MED (MISCELLANEOUS) IMPLANT
SOLUTION PARTIC MCRMTRX 500MG (Tissue) IMPLANT
SPONGE GAUZE 4X4 12PLY (GAUZE/BANDAGES/DRESSINGS) ×2 IMPLANT
SPONGE LAP 18X18 X RAY DECT (DISPOSABLE) ×1 IMPLANT
SPONGE LAP 4X18 X RAY DECT (DISPOSABLE) IMPLANT
STAPLER VISISTAT 35W (STAPLE) IMPLANT
STRIP CLOSURE SKIN 1/2X4 (GAUZE/BANDAGES/DRESSINGS) ×2 IMPLANT
SUCTION FRAZIER TIP 10 FR DISP (SUCTIONS) IMPLANT
SURGILUBE 2OZ TUBE FLIPTOP (MISCELLANEOUS) IMPLANT
SUT MNCRL AB 4-0 PS2 18 (SUTURE) ×2 IMPLANT
SUT VIC AB 3-0 FS2 27 (SUTURE) IMPLANT
SUT VIC AB 5-0 PS2 18 (SUTURE) ×2 IMPLANT
SUT VICRYL 4-0 PS2 18IN ABS (SUTURE) IMPLANT
SYR BULB IRRIGATION 50ML (SYRINGE) ×2 IMPLANT
SYR CONTROL 10ML LL (SYRINGE) IMPLANT
TAPE HYPAFIX 6X30 (GAUZE/BANDAGES/DRESSINGS) IMPLANT
TOWEL OR 17X24 6PK STRL BLUE (TOWEL DISPOSABLE) ×2 IMPLANT
TRAY DSU PREP LF (CUSTOM PROCEDURE TRAY) ×1 IMPLANT
TUBE CONNECTING 20X1/4 (TUBING) IMPLANT
UNDERPAD 30X30 INCONTINENT (UNDERPADS AND DIAPERS) IMPLANT
WATER STERILE IRR 1000ML POUR (IV SOLUTION) IMPLANT
YANKAUER SUCT BULB TIP NO VENT (SUCTIONS) IMPLANT

## 2012-02-07 NOTE — Transfer of Care (Signed)
Immediate Anesthesia Transfer of Care Note  Patient: Pamela Owens  Procedure(s) Performed: Procedure(s) (LRB): IRRIGATION AND DEBRIDEMENT WOUND (N/A)  Patient Location: PACU  Anesthesia Type: General  Level of Consciousness: sedated  Airway & Oxygen Therapy: Patient Spontanous Breathing and Patient connected to face mask oxygen  Post-op Assessment: Report given to PACU RN and Post -op Vital signs reviewed and stable  Post vital signs: Reviewed and stable  Complications: No apparent anesthesia complications

## 2012-02-07 NOTE — H&P (Signed)
Pamela Owens is an 71 y.o. female.   Chief Complaint: Posterior thoracic skin ulcer HPI: The patient is a 71 yrs old wf here for surgical treatment of her back skin ulceration.  She underwent excision of the back skin cancer with negative margins obtained. She now has a large defect of the back.  Past Medical History  Diagnosis Date  . No pertinent past medical history   . Skin cancer   . Empyema lung 2006    rt chest-resp failure-on vent-rt vats    Past Surgical History  Procedure Date  . No past surgeries   . Bronchoscopy   . Chest tube insertion 2006    lt chest tube-empyema-    Family History  Problem Relation Age of Onset  . Cancer Sister    Social History:  reports that she has never smoked. She does not have any smokeless tobacco history on file. She reports that she does not drink alcohol or use illicit drugs.  Allergies: No Known Allergies  Medications Prior to Admission  Medication Sig Dispense Refill  . doxycycline (DORYX) 100 MG DR capsule Take 100 mg by mouth 2 (two) times daily.      Marland Kitchen HYDROcodone-acetaminophen (NORCO) 5-325 MG per tablet         No results found for this or any previous visit (from the past 48 hour(s)). No results found.  Review of Systems  Constitutional: Negative.   HENT: Negative.   Eyes: Negative.   Cardiovascular: Negative.   Gastrointestinal: Negative.   Genitourinary: Negative.   Musculoskeletal: Negative.   Skin: Negative.   Neurological: Negative.   Endo/Heme/Allergies: Negative.   Psychiatric/Behavioral: Negative.     Height 5\' 3"  (1.6 m), weight 46.72 kg (103 lb). Physical Exam  Constitutional: She appears well-developed and well-nourished.  HENT:  Head: Normocephalic and atraumatic.  Right Ear: External ear normal.  Left Ear: External ear normal.  Eyes: Conjunctivae are normal. Pupils are equal, round, and reactive to light.  Neck: Normal range of motion.  Cardiovascular: Normal rate.   Respiratory: Effort normal.  No respiratory distress. She has no wheezes.  GI: Soft. She exhibits no distension.  Musculoskeletal: Normal range of motion.       Back:  Neurological: She is alert.  Skin: There is erythema.  Psychiatric: She has a normal mood and affect. Her behavior is normal. Judgment and thought content normal.     Assessment/Plan Back ulcer - irrigation and placement of ACell with the VAC. Risks and complications reviewed and include bleeding, pain, scar, infection and risk of complication. Consent signed.  SANGER,CLAIRE 02/07/2012, 11:00 AM

## 2012-02-07 NOTE — Anesthesia Postprocedure Evaluation (Signed)
  Anesthesia Post-op Note  Patient: Pamela Owens  Procedure(s) Performed: Procedure(s) (LRB): IRRIGATION AND DEBRIDEMENT WOUND (N/A)  Patient Location: PACU  Anesthesia Type: General  Level of Consciousness: awake, alert  and oriented  Airway and Oxygen Therapy: Patient Spontanous Breathing  Post-op Pain: none  Post-op Assessment: Post-op Vital signs reviewed, Patient's Cardiovascular Status Stable, Respiratory Function Stable, Patent Airway and No signs of Nausea or vomiting  Post-op Vital Signs: Reviewed and stable  Complications: No apparent anesthesia complications

## 2012-02-07 NOTE — Brief Op Note (Signed)
02/07/2012  1:46 PM  PATIENT:  Pamela Owens  71 y.o. female  PRE-OPERATIVE DIAGNOSIS:  Posterior thoracic ulceration after resection of skin cancer  POST-OPERATIVE DIAGNOSIS:  Posterior thoracic ulceration  PROCEDURE:  Procedure(s) (LRB): IRRIGATION OF POSTERIOR THORACIC ULCER WITH PLACEMENT OF ACELL AND THE VAC  (10 X 5.5 CM)  SURGEON:  Surgeon(s) and Role:    * [employer], DO - Primary  PHYSICIAN ASSISTANT:   ASSISTANTS: Harrie Foreman, LPN   ANESTHESIA:   general  EBL:  Total I/O In: 700 [I.V.:700] Out: -   BLOOD ADMINISTERED:none  DRAINS: none   LOCAL MEDICATIONS USED:  NONE  SPECIMEN:  No Specimen  DISPOSITION OF SPECIMEN:  N/A  COUNTS:  YES  TOURNIQUET:  * No tourniquets in log *  DICTATION: DICTATED  PLAN OF CARE: Discharge to home after PACU  PATIENT DISPOSITION:  PACU - hemodynamically stable.   Delay start of Pharmacological VTE agent (>24hrs) due to surgical blood loss or risk of bleeding: no

## 2012-02-07 NOTE — Anesthesia Procedure Notes (Signed)
Procedure Name: LMA Insertion Date/Time: 02/07/2012 1:06 PM Performed by: Burna Cash Pre-anesthesia Checklist: Patient identified, Emergency Drugs available, Suction available and Patient being monitored Patient Re-evaluated:Patient Re-evaluated prior to inductionOxygen Delivery Method: Circle System Utilized Preoxygenation: Pre-oxygenation with 100% oxygen Intubation Type: IV induction Ventilation: Mask ventilation without difficulty LMA: LMA inserted LMA Size: 4.0 Number of attempts: 1 Airway Equipment and Method: bite block Placement Confirmation: positive ETCO2 Tube secured with: Tape Dental Injury: Teeth and Oropharynx as per pre-operative assessment

## 2012-02-07 NOTE — Anesthesia Preprocedure Evaluation (Signed)
Anesthesia Evaluation  Patient identified by MRN, date of birth, ID band Patient awake    Reviewed: Allergy & Precautions, H&P , NPO status , Patient's Chart, lab work & pertinent test results  Airway Mallampati: II TM Distance: >3 FB Neck ROM: Full    Dental No notable dental hx. (+) Teeth Intact and Dental Advisory Given   Pulmonary neg pulmonary ROS,  breath sounds clear to auscultation  Pulmonary exam normal       Cardiovascular negative cardio ROS  Rhythm:Regular Rate:Normal     Neuro/Psych negative neurological ROS  negative psych ROS   GI/Hepatic negative GI ROS, Neg liver ROS,   Endo/Other  negative endocrine ROS  Renal/GU negative Renal ROS  negative genitourinary   Musculoskeletal   Abdominal   Peds  Hematology negative hematology ROS (+)   Anesthesia Other Findings   Reproductive/Obstetrics negative OB ROS                           Anesthesia Physical Anesthesia Plan  ASA: I  Anesthesia Plan: General   Post-op Pain Management:    Induction: Intravenous  Airway Management Planned: LMA  Additional Equipment:   Intra-op Plan:   Post-operative Plan: Extubation in OR  Informed Consent: I have reviewed the patients History and Physical, chart, labs and discussed the procedure including the risks, benefits and alternatives for the proposed anesthesia with the patient or authorized representative who has indicated his/her understanding and acceptance.   Dental advisory given  Plan Discussed with: CRNA  Anesthesia Plan Comments:         Anesthesia Quick Evaluation  

## 2012-02-08 NOTE — Op Note (Signed)
NAMEGRETE, BOSKO                 ACCOUNT NO.:  [id number]  MEDICAL RECORD NO.:  [id number]  LOCATION:                                 FACILITY:  PHYSICIAN:  Wayland Denis, DO      DATE OF BIRTH:  11-21-1940  DATE OF PROCEDURE:  02/07/2012 DATE OF DISCHARGE:                              OPERATIVE REPORT   PREOPERATIVE DIAGNOSIS:  Posterior thoracic ulcer secondary to resection of skin cancer.  POSTOPERATIVE DIAGNOSIS:  Posterior thoracic ulcer secondary to resection of skin cancer.  PROCEDURE:  Irrigation with preparation of wound bed for placement of ACell and the VAC.  The size of the wound was 10 x 5.5 cm.  ATTENDING:  Wayland Denis, DO.  ASSISTANT:  Marcello Moores, LPN.  ANESTHESIA:  General.  INDICATION FOR PROCEDURE:  The patient is a 71 year old female who is here for follow up for surgical management of her back ulcer she had a resection of skin cancer, and comes for treatment.  DESCRIPTION OF PROCEDURE:  The patient was taken to the operating room after consent was confirmed and signed.  Risks and complications were reviewed including bleeding, pain, scar, risk of anesthesia, infection. She was placed on the operating room table in supine position and general anesthesia was administered.  Once adequate, a time-out was called and all information was confirmed to be correct.  She was placed in the left lateral position.  The 10 blade was used to debride the skin edges and hypertrophic granulation tissue.  Copious amounts of antibiotic irrigation was used to irrigate the wound.  Pressure was applied for hemostasis.  The sutures that had been placed by Dermatology were removed and Dermabond was placed over the area.  The ACell was prepared according to the manufacture's guidelines and pie crusted to help drainage.  The ACell powder 500 g was placed over the wound and then the ACell.  The ACell sheet was tacked in place with 5-0 Vicryl. Adaptic was applied with Hydrogel  and a VAC sponge set at 75 mmHg continuous pressure.  The patient tolerated the procedure well.  There were no complications.  She was awoken and taken to recovery room in stable condition.     Alan Ripper Sanger, DO     CS/MEDQ  D:  02/07/2012  T:  02/08/2012  Job:  [id number]

## 2012-02-09 ENCOUNTER — Encounter (HOSPITAL_BASED_OUTPATIENT_CLINIC_OR_DEPARTMENT_OTHER): Payer: Self-pay | Admitting: Plastic Surgery

## 2012-02-13 NOTE — Progress Notes (Signed)
[facility]  NAME:  Pamela Owens, Pamela Owens                 ACCOUNT NO.:  [id number]  MEDICAL RECORD NO.:  [id number]      DATE OF BIRTH:  11-08-1940  PHYSICIAN:  Wayland Denis, DO       VISIT DATE:  02/12/2012                                  OFFICE VISIT   The patient is here for followup for her back ulceration secondary to excision of the skin graft.  She went to the OR.  Irrigation, debridement, and placement of ACell with the VAC was performed.  She is doing extremely well with mostly all of the ACell being incorporated. She is granulating well and overall looks much healthier.  We will continue with the VAC, Adaptic, and Hydrogel and get her scheduled for skin graft.     Alan Ripper Sanger, DO     CS/MEDQ  D:  02/12/2012  T:  02/13/2012  Job:  [id number]

## 2012-02-19 ENCOUNTER — Encounter (HOSPITAL_BASED_OUTPATIENT_CLINIC_OR_DEPARTMENT_OTHER): Payer: Medicare PPO

## 2012-02-20 NOTE — Progress Notes (Signed)
[facility]  NAME:  [facility], Matthew                      ACCOUNT NO.:  MEDICAL RECORD NO.:  [id number]      DATE OF BIRTH:  Apr 15, 1941  PHYSICIAN:  Wayland Denis, DO       VISIT DATE:  02/19/2012                                  OFFICE VISIT   Ms. Earnhart is here for followup after irrigation debridement and placement of ACell on her back with a VAC.  Her sizes have decreased tremendously and she has filled in __________, so she is looking great. We will continue with the VAC, without the Adaptic, and we will plan on a skin graft, next week.     Wayland Denis, DO     CS/MEDQ  D:  02/19/2012  T:  02/20/2012  Job:  [id number]

## 2012-02-23 ENCOUNTER — Other Ambulatory Visit: Payer: Self-pay | Admitting: Plastic Surgery

## 2012-02-23 DIAGNOSIS — L98429 Non-pressure chronic ulcer of back with unspecified severity: Secondary | ICD-10-CM

## 2012-02-26 NOTE — Progress Notes (Signed)
No labs needed

## 2012-02-29 ENCOUNTER — Encounter (HOSPITAL_BASED_OUTPATIENT_CLINIC_OR_DEPARTMENT_OTHER): Payer: Self-pay | Admitting: Anesthesiology

## 2012-02-29 ENCOUNTER — Ambulatory Visit (HOSPITAL_BASED_OUTPATIENT_CLINIC_OR_DEPARTMENT_OTHER): Payer: Medicare PPO | Admitting: Anesthesiology

## 2012-02-29 ENCOUNTER — Encounter (HOSPITAL_BASED_OUTPATIENT_CLINIC_OR_DEPARTMENT_OTHER): Payer: Self-pay | Admitting: *Deleted

## 2012-02-29 ENCOUNTER — Encounter (HOSPITAL_BASED_OUTPATIENT_CLINIC_OR_DEPARTMENT_OTHER)
Admission: RE | Disposition: A | Discharge status: Home / Self Care | Payer: Self-pay | Source: Ambulatory Visit | Attending: Plastic Surgery

## 2012-02-29 ENCOUNTER — Ambulatory Visit (HOSPITAL_BASED_OUTPATIENT_CLINIC_OR_DEPARTMENT_OTHER)
Admission: RE | Admit: 2012-02-29 | Discharge: 2012-02-29 | Disposition: A | Discharge status: Home / Self Care | Payer: Medicare PPO | Source: Ambulatory Visit | Attending: Plastic Surgery | Admitting: Plastic Surgery

## 2012-02-29 DIAGNOSIS — Z428 Encounter for other plastic and reconstructive surgery following medical procedure or healed injury: Secondary | ICD-10-CM | POA: Insufficient documentation

## 2012-02-29 DIAGNOSIS — F17200 Nicotine dependence, unspecified, uncomplicated: Secondary | ICD-10-CM | POA: Insufficient documentation

## 2012-02-29 DIAGNOSIS — J449 Chronic obstructive pulmonary disease, unspecified: Secondary | ICD-10-CM | POA: Insufficient documentation

## 2012-02-29 DIAGNOSIS — J4489 Other specified chronic obstructive pulmonary disease: Secondary | ICD-10-CM | POA: Insufficient documentation

## 2012-02-29 DIAGNOSIS — Z85828 Personal history of other malignant neoplasm of skin: Secondary | ICD-10-CM | POA: Insufficient documentation

## 2012-02-29 DIAGNOSIS — L98429 Non-pressure chronic ulcer of back with unspecified severity: Secondary | ICD-10-CM

## 2012-02-29 HISTORY — PX: SKIN SPLIT GRAFT: SHX444

## 2012-02-29 LAB — POCT HEMOGLOBIN-HEMACUE: Hemoglobin: 13.1 g/dL (ref 12.0–15.0)

## 2012-02-29 SURGERY — APPLICATION, GRAFT, SKIN, SPLIT-THICKNESS
Anesthesia: General | Site: Back | Wound class: Clean Contaminated

## 2012-02-29 MED ORDER — LACTATED RINGERS IV SOLN
INTRAVENOUS | Status: DC
  Administered 2012-02-29 (×2): via INTRAVENOUS

## 2012-02-29 MED ORDER — PHENYLEPHRINE HCL 10 MG/ML IJ SOLN
INTRAMUSCULAR | Status: DC | PRN
  Administered 2012-02-29: 40 ug via INTRAVENOUS

## 2012-02-29 MED ORDER — LIDOCAINE HCL (CARDIAC) 20 MG/ML IV SOLN
INTRAVENOUS | Status: DC | PRN
  Administered 2012-02-29: 30 mg via INTRAVENOUS

## 2012-02-29 MED ORDER — FENTANYL CITRATE 0.05 MG/ML IJ SOLN
INTRAMUSCULAR | Status: DC | PRN
  Administered 2012-02-29: 50 ug via INTRAVENOUS

## 2012-02-29 MED ORDER — MIDAZOLAM HCL 5 MG/5ML IJ SOLN
INTRAMUSCULAR | Status: DC | PRN
  Administered 2012-02-29: 1 mg via INTRAVENOUS

## 2012-02-29 MED ORDER — CEFAZOLIN SODIUM-DEXTROSE 2-3 GM-% IV SOLR
2.0000 g | INTRAVENOUS | Status: AC
  Administered 2012-02-29: 2 g via INTRAVENOUS

## 2012-02-29 MED ORDER — SODIUM CHLORIDE 0.9 % IR SOLN
Status: DC | PRN
  Administered 2012-02-29: 11:00:00

## 2012-02-29 MED ORDER — PROPOFOL 10 MG/ML IV EMUL
INTRAVENOUS | Status: DC | PRN
  Administered 2012-02-29: 150 mg via INTRAVENOUS

## 2012-02-29 MED ORDER — SUCCINYLCHOLINE CHLORIDE 20 MG/ML IJ SOLN
INTRAMUSCULAR | Status: DC | PRN
  Administered 2012-02-29: 80 mg via INTRAVENOUS

## 2012-02-29 MED ORDER — DEXAMETHASONE SODIUM PHOSPHATE 4 MG/ML IJ SOLN
INTRAMUSCULAR | Status: DC | PRN
  Administered 2012-02-29: 5 mg via INTRAVENOUS

## 2012-02-29 MED ORDER — BUPIVACAINE-EPINEPHRINE 0.25% -1:200000 IJ SOLN
INTRAMUSCULAR | Status: DC | PRN
  Administered 2012-02-29: 10 mL

## 2012-02-29 SURGICAL SUPPLY — 78 items
ADH SKN CLS APL DERMABOND .7 (GAUZE/BANDAGES/DRESSINGS) ×1
APL SKNCLS STERI-STRIP NONHPOA (GAUZE/BANDAGES/DRESSINGS)
BAG DECANTER FOR FLEXI CONT (MISCELLANEOUS) IMPLANT
BALL CTTN LRG ABS STRL LF (GAUZE/BANDAGES/DRESSINGS) ×2
BANDAGE ELASTIC 3 VELCRO ST LF (GAUZE/BANDAGES/DRESSINGS) IMPLANT
BANDAGE ELASTIC 4 VELCRO ST LF (GAUZE/BANDAGES/DRESSINGS) IMPLANT
BANDAGE ELASTIC 6 VELCRO ST LF (GAUZE/BANDAGES/DRESSINGS) IMPLANT
BANDAGE GAUZE ELAST BULKY 4 IN (GAUZE/BANDAGES/DRESSINGS) IMPLANT
BENZOIN TINCTURE PRP APPL 2/3 (GAUZE/BANDAGES/DRESSINGS) IMPLANT
BLADE DERMATOME SS (BLADE) ×1 IMPLANT
BLADE HEX COATED 2.75 (ELECTRODE) IMPLANT
BLADE SURG 10 STRL SS (BLADE) IMPLANT
BLADE SURG 15 STRL LF DISP TIS (BLADE) ×1 IMPLANT
BLADE SURG 15 STRL SS (BLADE) ×2
BLADE SURG ROTATE 9660 (MISCELLANEOUS) IMPLANT
BNDG COHESIVE 4X5 TAN STRL (GAUZE/BANDAGES/DRESSINGS) IMPLANT
CLOTH BEACON ORANGE TIMEOUT ST (SAFETY) ×2 IMPLANT
COTTONBALL LRG STERILE PKG (GAUZE/BANDAGES/DRESSINGS) ×4 IMPLANT
COVER MAYO STAND STRL (DRAPES) ×2 IMPLANT
COVER TABLE BACK 60X90 (DRAPES) ×2 IMPLANT
DECANTER SPIKE VIAL GLASS SM (MISCELLANEOUS) IMPLANT
DERMABOND ADVANCED (GAUZE/BANDAGES/DRESSINGS) ×1
DERMABOND ADVANCED .7 DNX12 (GAUZE/BANDAGES/DRESSINGS) IMPLANT
DERMACARRIERS GRAFT 1 TO 1.5 (DISPOSABLE)
DRAPE EXTREMITY T 121X128X90 (DRAPE) IMPLANT
DRAPE INCISE IOBAN 66X45 STRL (DRAPES) ×1 IMPLANT
DRAPE PED LAPAROTOMY (DRAPES) IMPLANT
DRAPE SURG 17X23 STRL (DRAPES) ×8 IMPLANT
DRAPE U-SHAPE 76X120 STRL (DRAPES) ×1 IMPLANT
DRSG ADAPTIC 3X8 NADH LF (GAUZE/BANDAGES/DRESSINGS) ×1 IMPLANT
DRSG EMULSION OIL 3X3 NADH (GAUZE/BANDAGES/DRESSINGS) ×3 IMPLANT
DRSG OPSITE 6X11 MED (GAUZE/BANDAGES/DRESSINGS) IMPLANT
DRSG PAD ABDOMINAL 8X10 ST (GAUZE/BANDAGES/DRESSINGS) IMPLANT
DRSG TEGADERM 4X10 (GAUZE/BANDAGES/DRESSINGS) IMPLANT
ELECT REM PT RETURN 9FT ADLT (ELECTROSURGICAL)
ELECTRODE REM PT RTRN 9FT ADLT (ELECTROSURGICAL) IMPLANT
GAUZE SPONGE 4X4 16PLY XRAY LF (GAUZE/BANDAGES/DRESSINGS) IMPLANT
GLOVE BIO SURGEON STRL SZ 6.5 (GLOVE) ×2 IMPLANT
GLOVE SKINSENSE NS SZ6.5 (GLOVE) ×1
GLOVE SKINSENSE NS SZ7.0 (GLOVE) ×1
GLOVE SKINSENSE STRL SZ6.5 (GLOVE) ×1 IMPLANT
GLOVE SKINSENSE STRL SZ7.0 (GLOVE) ×1 IMPLANT
GOWN PREVENTION PLUS XLARGE (GOWN DISPOSABLE) ×6 IMPLANT
GRAFT DERMACARRIERS 1 TO 1.5 (DISPOSABLE) IMPLANT
MATRIX SURGICAL PSM 7X10CM (Tissue) ×1 IMPLANT
NEEDLE 27GAX1X1/2 (NEEDLE) ×2 IMPLANT
NS IRRIG 1000ML POUR BTL (IV SOLUTION) ×2 IMPLANT
PACK BASIN DAY SURGERY FS (CUSTOM PROCEDURE TRAY) ×2 IMPLANT
PAD CAST 3X4 CTTN HI CHSV (CAST SUPPLIES) IMPLANT
PAD CAST 4YDX4 CTTN HI CHSV (CAST SUPPLIES) IMPLANT
PADDING CAST ABS 4INX4YD NS (CAST SUPPLIES)
PADDING CAST ABS COTTON 4X4 ST (CAST SUPPLIES) IMPLANT
PADDING CAST COTTON 3X4 STRL (CAST SUPPLIES)
PADDING CAST COTTON 4X4 STRL (CAST SUPPLIES)
PENCIL BUTTON HOLSTER BLD 10FT (ELECTRODE) IMPLANT
SHEET MEDIUM DRAPE 40X70 STRL (DRAPES) IMPLANT
SPONGE GAUZE 4X4 12PLY (GAUZE/BANDAGES/DRESSINGS) ×4 IMPLANT
SPONGE LAP 18X18 X RAY DECT (DISPOSABLE) ×2 IMPLANT
STAPLER VISISTAT 35W (STAPLE) IMPLANT
STOCKINETTE 4X48 STRL (DRAPES) IMPLANT
STOCKINETTE 6  STRL (DRAPES)
STOCKINETTE 6 STRL (DRAPES) IMPLANT
STOCKINETTE IMPERVIOUS LG (DRAPES) IMPLANT
STRIP CLOSURE SKIN 1/2X4 (GAUZE/BANDAGES/DRESSINGS) IMPLANT
SURGILUBE 2OZ TUBE FLIPTOP (MISCELLANEOUS) IMPLANT
SUT MON AB 5-0 PS2 18 (SUTURE) IMPLANT
SUT SILK 3 0 SH CR/8 (SUTURE) IMPLANT
SUT SILK 4 0 SH CR/8 (SUTURE) IMPLANT
SUT VIC AB 3-0 FS2 27 (SUTURE) IMPLANT
SUT VIC AB 5-0 P-3 18X BRD (SUTURE) ×3 IMPLANT
SUT VIC AB 5-0 P3 18 (SUTURE) ×6
SUT VICRYL 4-0 PS2 18IN ABS (SUTURE) IMPLANT
SYR BULB 3OZ (MISCELLANEOUS) ×2 IMPLANT
SYR CONTROL 10ML LL (SYRINGE) ×2 IMPLANT
TOWEL OR 17X24 6PK STRL BLUE (TOWEL DISPOSABLE) ×2 IMPLANT
TRAY DSU PREP LF (CUSTOM PROCEDURE TRAY) ×2 IMPLANT
UNDERPAD 30X30 INCONTINENT (UNDERPADS AND DIAPERS) IMPLANT
WATER STERILE IRR 1000ML POUR (IV SOLUTION) IMPLANT

## 2012-02-29 NOTE — H&P (View-Only) (Signed)
[facility]  NAME:  [name], [name]                      ACCOUNT NO.:  MEDICAL RECORD NO.:  [id number]      DATE OF BIRTH:  10/12/1940  PHYSICIAN:  Adonte Vanriper Sanger, DO       VISIT DATE:  02/19/2012                                  OFFICE VISIT   [name] is here for followup after irrigation debridement and placement of ACell on her back with a VAC.  Her sizes have decreased tremendously and she has filled in __________, so she is looking great. We will continue with the VAC, without the Adaptic, and we will plan on a skin graft, next week.     Alphus Zeck Sanger, DO     CS/MEDQ  D:  02/19/2012  T:  02/20/2012  Job:  [id number] 

## 2012-02-29 NOTE — Anesthesia Postprocedure Evaluation (Signed)
Anesthesia Post Note  Patient: Pamela Owens  Procedure(s) Performed: Procedure(s) (LRB): SKIN GRAFT SPLIT THICKNESS (N/A)  Anesthesia type: General  Patient location: PACU  Post pain: Pain level controlled and Adequate analgesia  Post assessment: Post-op Vital signs reviewed, Patient's Cardiovascular Status Stable, Respiratory Function Stable, Patent Airway and Pain level controlled  Last Vitals:  Filed Vitals:   02/29/12 1130  BP: 115/44  Pulse: 59  Temp:   Resp: 26    Post vital signs: Reviewed and stable  Level of consciousness: awake, alert  and oriented  Complications: No apparent anesthesia complications

## 2012-02-29 NOTE — Anesthesia Preprocedure Evaluation (Addendum)
Anesthesia Evaluation  Patient identified by MRN, date of birth, ID band Patient awake    Reviewed: Allergy & Precautions, H&P , NPO status , Patient's Chart, lab work & pertinent test results  History of Anesthesia Complications Negative for: history of anesthetic complications  Airway Mallampati: II TM Distance: >3 FB Neck ROM: full    Dental  (+) Caps, Implants and Dental Advisory Given   Pulmonary COPDCurrent Smoker,  breath sounds clear to auscultation  Pulmonary exam normal       Cardiovascular negative cardio ROS  Rhythm:Regular Rate:Normal     Neuro/Psych negative neurological ROS  negative psych ROS   GI/Hepatic negative GI ROS, Neg liver ROS,   Endo/Other  negative endocrine ROS  Renal/GU negative Renal ROS     Musculoskeletal   Abdominal   Peds  Hematology   Anesthesia Other Findings   Reproductive/Obstetrics                          Anesthesia Physical Anesthesia Plan  ASA: III  Anesthesia Plan: General   Post-op Pain Management:    Induction: Intravenous  Airway Management Planned: Oral ETT  Additional Equipment:   Intra-op Plan:   Post-operative Plan: Extubation in OR  Informed Consent: I have reviewed the patients History and Physical, chart, labs and discussed the procedure including the risks, benefits and alternatives for the proposed anesthesia with the patient or authorized representative who has indicated his/her understanding and acceptance.   Dental advisory given  Plan Discussed with: CRNA and Surgeon  Anesthesia Plan Comments: (Plan routine monitors, GETA)       Anesthesia Quick Evaluation

## 2012-02-29 NOTE — Interval H&P Note (Signed)
History and Physical Interval Note:  02/29/2012 9:45 AM  Pamela Owens  has presented today for surgery, with the diagnosis of back wound  The various methods of treatment have been discussed with the patient and family. After consideration of risks, benefits and other options for treatment, the patient has consented to  Procedure(s) (LRB): SKIN GRAFT SPLIT THICKNESS (N/A) as a surgical intervention .  The patient's history has been reviewed, patient examined, no change in status, stable for surgery.  I have reviewed the patient's chart and labs.  Questions were answered to the patient's satisfaction.     SANGER,Justun Anaya

## 2012-02-29 NOTE — Brief Op Note (Signed)
02/29/2012  10:55 AM  PATIENT:  Pamela Owens  71 y.o. female  PRE-OPERATIVE DIAGNOSIS:  back wound ulcer  POST-OPERATIVE DIAGNOSIS:  back wound ulcer  PROCEDURE:  Procedure(s) (LRB): SKIN GRAFT SPLIT THICKNESS TO BACK  SURGEON:  Surgeon(s) and Role:    * Sherah Lund Sanger, DO - Primary  PHYSICIAN ASSISTANT:   ASSISTANTS: Harrie Foreman, LPN   ANESTHESIA:   local and general  EBL:     BLOOD ADMINISTERED:none  DRAINS: none   LOCAL MEDICATIONS USED:  MARCAINE     SPECIMEN:  No Specimen  DISPOSITION OF SPECIMEN:  N/A  COUNTS:  YES  TOURNIQUET:  * No tourniquets in log *  DICTATION: dictated  PLAN OF CARE: Discharge to home after PACU  PATIENT DISPOSITION:  PACU - hemodynamically stable.   Delay start of Pharmacological VTE agent (>24hrs) due to surgical blood loss or risk of bleeding: no

## 2012-02-29 NOTE — Transfer of Care (Signed)
Immediate Anesthesia Transfer of Care Note  Patient: Pamela Owens  Procedure(s) Performed: Procedure(s) (LRB): SKIN GRAFT SPLIT THICKNESS (N/A)  Patient Location: PACU  Anesthesia Type: General  Level of Consciousness: awake, alert  and oriented  Airway & Oxygen Therapy: Patient Spontanous Breathing and Patient connected to face mask oxygen  Post-op Assessment: Report given to PACU RN and Post -op Vital signs reviewed and stable  Post vital signs: Reviewed and stable  Complications: No apparent anesthesia complications

## 2012-03-01 ENCOUNTER — Encounter (HOSPITAL_BASED_OUTPATIENT_CLINIC_OR_DEPARTMENT_OTHER): Payer: Self-pay | Admitting: Plastic Surgery

## 2012-03-01 NOTE — Op Note (Signed)
[name], Pamela Owens NO.:  [id number]  MEDICAL RECORD NO.:  [id number]  LOCATION[facility]    [facility]  PHYSICIAN:  Wayland Denis, DO      DATE OF BIRTH:  08/30/1940  DATE OF PROCEDURE:  02/29/2012 DATE OF DISCHARGE:                              OPERATIVE REPORT   PREOPERATIVE DIAGNOSIS:  Right back ulcer.  POSTOPERATIVE DIAGNOSIS:  Right back ulcer.  PROCEDURE:  Preparation of wound bed for placement of split-thickness skin graft, 8 x 2 cm, with placement of VAC and ACell placement to donor site.  SURGEON:  Wayland Denis, DO  ASSISTANT:  Marcello Moores, LPN  ANESTHESIA:  General.  INDICATION FOR PROCEDURE:  The patient is a 71 year old female had excision of a skin cancer on her back.  She underwent irrigation and debridement, placement of ACell and the VAC in the past and presents for skin graft placement with the VAC.  Risks and complications were reviewed and discussed and consent was signed and confirmed.  Risks included bleeding, pain, scar, risk of anesthesia, and infection.  DESCRIPTION OF PROCEDURE:  The patient was taken to the operating room, placed on the operating room table in supine position.  General anesthesia was administered.  Once adequate, a time-out was called.  All information was confirmed to be correct.  She was prepped and draped in the usual sterile fashion after placing her in the left lateral position.  Her back and right thigh were prepped.  A 0.25% bupivacaine with epinephrine was injected in the thigh for intraoperative hemostasis and postop pain management.  The wound was irrigated with antibiotic solution.  The dermatome  was set at 12/1000s of an inch and a 3-inch guard was used and to obtain the skin graft from the right thigh donor site.  The graft was then placed on the back defect and tacked into place with 5-0 Vicryl.  Adaptic was applied with the VAC and the VAC was set at 125 mm  continuous pressure with an excellent seal.  The ACell was prepared according to the manufacture guidelines and pie crusted and placed on the donor site. Adaptic was applied on top of it with Dermabond to help keep it in place.  Hydrogel, 4x4, and Ioban.  The patient tolerated the procedure well.  There were no complications.  She was awoken and taken to the recovery room in stable condition.     Wayland Denis, DO     CS/MEDQ  D:  02/29/2012  T:  03/01/2012  Job:  [id number]

## 2012-03-04 ENCOUNTER — Encounter (HOSPITAL_BASED_OUTPATIENT_CLINIC_OR_DEPARTMENT_OTHER): Payer: Medicare PPO | Attending: General Surgery

## 2012-03-04 DIAGNOSIS — L98499 Non-pressure chronic ulcer of skin of other sites with unspecified severity: Secondary | ICD-10-CM | POA: Insufficient documentation

## 2012-03-12 NOTE — Progress Notes (Signed)
[facility]  NAME:  Pamela Owens, Pamela Owens                 ACCOUNT NO.:  [id number]  MEDICAL RECORD NO.:  [id number]      DATE OF BIRTH:  [birth year]  PHYSICIAN:  Wayland Denis, DO       VISIT DATE:  03/11/2012                                  OFFICE VISIT   The patient is a 71 year old female who is here for followup after undergoing skin graft placement on her back with the VAC.  She has done extremely well.  She has partial take of the skin graft, but overall markedly improved from her first visit.  We went ahead and removed the VAC today and the Adaptic, and we will go with Xeroform dressing change every other day.  We will see her back in 1 week.     Wayland Denis, DO     CS/MEDQ  D:  03/11/2012  T:  03/12/2012  Job:  [id number]

## 2012-03-19 NOTE — Progress Notes (Signed)
[facility]  NAME:  Pamela Owens, Pamela Owens                 ACCOUNT NO.:  [id number]  MEDICAL RECORD NO.:  [id number]      DATE OF BIRTH:  [birth year]  PHYSICIAN:  Wayland Denis, DO       VISIT DATE:  03/18/2012                                  OFFICE VISIT   The patient is a 71 year old female who is here for followup after undergoing irrigation and debridement with A-Cell and VAC placement in the OR followed by skin graft few weeks after that, she has done extremely well.  She is 3 weeks out.  Her leg has completely healed. The back has one small area that is granulating and not yet completely epithelialized.  There has been no change in her medications or social history.  I recommend continuing with local dressings with Endoform for the next week and then she is probably going to be healed after that. The dressing was taken off her leg and she has just to use lotion.     Wayland Denis, DO     CS/MEDQ  D:  03/18/2012  T:  03/19/2012  Job:  [id number]

## 2012-03-26 NOTE — Progress Notes (Signed)
[facility]  NAME:  Pamela Owens, Pamela Owens                 ACCOUNT NO.:  [id number]  MEDICAL RECORD NO.:  [id number]      DATE OF BIRTH:  May 08, 1941  PHYSICIAN:  Wayland Denis, DO       VISIT DATE:  03/25/2012                                  OFFICE VISIT   The patient is a 71 year old female, who is here for followup on her back ulcer secondary to excision of a malignancy.  She has done extremely well and is completely healed today with 100% take of the graft.  She is very pleased with her results.  Her donor site has healed as well.  She should continue with lotion on the area to keep it moist and let us know if she needs to see [place] in the future.     Wayland Denis, DO     CS/MEDQ  D:  03/25/2012  T:  03/26/2012  Job:  [id number]

## 2012-09-15 ENCOUNTER — Ambulatory Visit (INDEPENDENT_AMBULATORY_CARE_PROVIDER_SITE_OTHER): Payer: Medicare HMO | Admitting: Internal Medicine

## 2012-09-15 VITALS — BP 196/83 | HR 73 | Temp 98.3°F | Resp 18 | Ht 61.0 in | Wt 106.0 lb

## 2012-09-15 DIAGNOSIS — H6120 Impacted cerumen, unspecified ear: Secondary | ICD-10-CM

## 2012-09-15 DIAGNOSIS — H9190 Unspecified hearing loss, unspecified ear: Secondary | ICD-10-CM

## 2012-09-15 MED ORDER — CARBAMIDE PEROXIDE 6.5 % OT SOLN: 5.0000 [drp] | Freq: Two times a day (BID) | OTIC | Status: AC

## 2012-09-16 NOTE — Progress Notes (Signed)
Brought in by son because specialist noted that she had diminished hearing in bed cerumen impaction Son noticed change in hearing many months ago She describes no problems/no ear pain She tries to avoid doctors Has recently had a number of problems that she had to deal with especially various skin cancers  Patient Active Problem List  Diagnosis  . HYPERTENSION  . EMPYEMA  . Skin ulcer of thoracic region    Exam BP 196/83  Pulse 73  Temp(Src) 98.3 F (36.8 C) (Oral)  Resp 18  Ht 5\' 1"  (1.549 m)  Wt 106 lb (48.081 kg)  BMI 20.04 kg/m2  SpO2 98% She is upset about her long wait Both canals are impacted with dry hard cerumen  Irrigation failed so Colace was instilled for 10 minutes Ear irrigation failed again Curet-plastic= would be in in response to attempts at removal which failed  Problem #1 significant cerumen impaction with diminished hearing  Referred to ear nose and throat for Suction clearance Use Debrox until then

## 2012-09-19 ENCOUNTER — Ambulatory Visit
Admission: RE | Admit: 2012-09-19 | Discharge: 2012-09-19 | Disposition: A | Discharge status: Home / Self Care | Payer: Medicare PPO | Source: Ambulatory Visit | Attending: Family Medicine | Admitting: Family Medicine

## 2012-09-19 ENCOUNTER — Other Ambulatory Visit: Payer: Self-pay | Admitting: Family Medicine

## 2012-09-19 DIAGNOSIS — M79609 Pain in unspecified limb: Secondary | ICD-10-CM

## 2012-09-26 ENCOUNTER — Ambulatory Visit: Payer: Medicare PPO | Attending: Family Medicine

## 2012-09-26 DIAGNOSIS — M25569 Pain in unspecified knee: Secondary | ICD-10-CM | POA: Insufficient documentation

## 2012-09-26 DIAGNOSIS — R5381 Other malaise: Secondary | ICD-10-CM | POA: Insufficient documentation

## 2012-09-26 DIAGNOSIS — IMO0001 Reserved for inherently not codable concepts without codable children: Secondary | ICD-10-CM | POA: Insufficient documentation

## 2012-09-26 DIAGNOSIS — R262 Difficulty in walking, not elsewhere classified: Secondary | ICD-10-CM | POA: Insufficient documentation

## 2012-10-01 ENCOUNTER — Ambulatory Visit: Payer: Medicare PPO

## 2012-10-02 ENCOUNTER — Ambulatory Visit: Payer: Medicare PPO | Attending: Family Medicine

## 2012-10-02 DIAGNOSIS — R5381 Other malaise: Secondary | ICD-10-CM | POA: Insufficient documentation

## 2012-10-02 DIAGNOSIS — M25569 Pain in unspecified knee: Secondary | ICD-10-CM | POA: Insufficient documentation

## 2012-10-02 DIAGNOSIS — IMO0001 Reserved for inherently not codable concepts without codable children: Secondary | ICD-10-CM | POA: Insufficient documentation

## 2012-10-02 DIAGNOSIS — R262 Difficulty in walking, not elsewhere classified: Secondary | ICD-10-CM | POA: Insufficient documentation

## 2012-10-03 ENCOUNTER — Ambulatory Visit: Payer: Medicare PPO | Admitting: Physical Therapy

## 2012-10-08 ENCOUNTER — Ambulatory Visit: Payer: Medicare PPO

## 2015-03-03 DIAGNOSIS — H6123 Impacted cerumen, bilateral: Secondary | ICD-10-CM | POA: Diagnosis not present

## 2015-03-03 DIAGNOSIS — I10 Essential (primary) hypertension: Secondary | ICD-10-CM | POA: Diagnosis not present

## 2015-04-09 DIAGNOSIS — H6123 Impacted cerumen, bilateral: Secondary | ICD-10-CM | POA: Diagnosis not present

## 2015-04-09 DIAGNOSIS — I10 Essential (primary) hypertension: Secondary | ICD-10-CM | POA: Diagnosis not present

## 2015-11-25 DIAGNOSIS — M5430 Sciatica, unspecified side: Secondary | ICD-10-CM | POA: Diagnosis not present

## 2016-07-28 DIAGNOSIS — H6123 Impacted cerumen, bilateral: Secondary | ICD-10-CM | POA: Diagnosis not present

## 2016-07-28 DIAGNOSIS — I10 Essential (primary) hypertension: Secondary | ICD-10-CM | POA: Diagnosis not present

## 2016-07-28 DIAGNOSIS — Z79899 Other long term (current) drug therapy: Secondary | ICD-10-CM | POA: Diagnosis not present

## 2016-07-28 DIAGNOSIS — Z23 Encounter for immunization: Secondary | ICD-10-CM | POA: Diagnosis not present

## 2016-08-24 DIAGNOSIS — H9011 Conductive hearing loss, unilateral, right ear, with unrestricted hearing on the contralateral side: Secondary | ICD-10-CM | POA: Diagnosis not present

## 2016-08-24 DIAGNOSIS — H6061 Unspecified chronic otitis externa, right ear: Secondary | ICD-10-CM | POA: Diagnosis not present

## 2017-01-30 DIAGNOSIS — B351 Tinea unguium: Secondary | ICD-10-CM | POA: Diagnosis not present

## 2017-08-06 DIAGNOSIS — I10 Essential (primary) hypertension: Secondary | ICD-10-CM | POA: Diagnosis not present

## 2017-08-06 DIAGNOSIS — H6123 Impacted cerumen, bilateral: Secondary | ICD-10-CM | POA: Diagnosis not present

## 2017-08-13 DIAGNOSIS — H6123 Impacted cerumen, bilateral: Secondary | ICD-10-CM | POA: Diagnosis not present

## 2017-11-20 DIAGNOSIS — C44319 Basal cell carcinoma of skin of other parts of face: Secondary | ICD-10-CM | POA: Diagnosis not present

## 2017-11-20 DIAGNOSIS — Z85828 Personal history of other malignant neoplasm of skin: Secondary | ICD-10-CM | POA: Diagnosis not present

## 2017-11-20 DIAGNOSIS — L821 Other seborrheic keratosis: Secondary | ICD-10-CM | POA: Diagnosis not present

## 2017-11-20 DIAGNOSIS — D225 Melanocytic nevi of trunk: Secondary | ICD-10-CM | POA: Diagnosis not present

## 2017-11-20 DIAGNOSIS — C44519 Basal cell carcinoma of skin of other part of trunk: Secondary | ICD-10-CM | POA: Diagnosis not present

## 2017-11-20 DIAGNOSIS — L570 Actinic keratosis: Secondary | ICD-10-CM | POA: Diagnosis not present

## 2017-11-20 DIAGNOSIS — D485 Neoplasm of uncertain behavior of skin: Secondary | ICD-10-CM | POA: Diagnosis not present

## 2017-11-20 DIAGNOSIS — C44612 Basal cell carcinoma of skin of right upper limb, including shoulder: Secondary | ICD-10-CM | POA: Diagnosis not present

## 2018-01-17 DIAGNOSIS — C44319 Basal cell carcinoma of skin of other parts of face: Secondary | ICD-10-CM | POA: Diagnosis not present

## 2018-02-13 DIAGNOSIS — H6123 Impacted cerumen, bilateral: Secondary | ICD-10-CM | POA: Diagnosis not present

## 2018-03-20 DIAGNOSIS — C44519 Basal cell carcinoma of skin of other part of trunk: Secondary | ICD-10-CM | POA: Diagnosis not present

## 2018-03-20 DIAGNOSIS — D485 Neoplasm of uncertain behavior of skin: Secondary | ICD-10-CM | POA: Diagnosis not present

## 2018-03-20 DIAGNOSIS — L989 Disorder of the skin and subcutaneous tissue, unspecified: Secondary | ICD-10-CM | POA: Diagnosis not present

## 2018-04-17 DIAGNOSIS — C44519 Basal cell carcinoma of skin of other part of trunk: Secondary | ICD-10-CM | POA: Diagnosis not present

## 2018-05-01 DIAGNOSIS — D0339 Melanoma in situ of other parts of face: Secondary | ICD-10-CM | POA: Diagnosis not present

## 2018-05-01 DIAGNOSIS — L989 Disorder of the skin and subcutaneous tissue, unspecified: Secondary | ICD-10-CM | POA: Diagnosis not present

## 2018-05-16 DIAGNOSIS — Z08 Encounter for follow-up examination after completed treatment for malignant neoplasm: Secondary | ICD-10-CM | POA: Diagnosis not present

## 2018-05-16 DIAGNOSIS — S0180XD Unspecified open wound of other part of head, subsequent encounter: Secondary | ICD-10-CM | POA: Diagnosis not present

## 2018-08-05 DIAGNOSIS — I10 Essential (primary) hypertension: Secondary | ICD-10-CM | POA: Diagnosis not present

## 2018-08-21 DIAGNOSIS — H6123 Impacted cerumen, bilateral: Secondary | ICD-10-CM | POA: Diagnosis not present

## 2018-12-11 DIAGNOSIS — M79605 Pain in left leg: Secondary | ICD-10-CM | POA: Diagnosis not present

## 2019-02-20 DIAGNOSIS — H6123 Impacted cerumen, bilateral: Secondary | ICD-10-CM | POA: Diagnosis not present

## 2019-03-17 DIAGNOSIS — C44519 Basal cell carcinoma of skin of other part of trunk: Secondary | ICD-10-CM | POA: Diagnosis not present

## 2019-03-17 DIAGNOSIS — D485 Neoplasm of uncertain behavior of skin: Secondary | ICD-10-CM | POA: Diagnosis not present

## 2019-03-17 DIAGNOSIS — C44529 Squamous cell carcinoma of skin of other part of trunk: Secondary | ICD-10-CM | POA: Diagnosis not present

## 2019-03-20 DIAGNOSIS — D485 Neoplasm of uncertain behavior of skin: Secondary | ICD-10-CM | POA: Diagnosis not present

## 2019-03-20 DIAGNOSIS — C44519 Basal cell carcinoma of skin of other part of trunk: Secondary | ICD-10-CM | POA: Diagnosis not present

## 2019-03-27 DIAGNOSIS — C44519 Basal cell carcinoma of skin of other part of trunk: Secondary | ICD-10-CM | POA: Diagnosis not present

## 2019-03-27 DIAGNOSIS — D485 Neoplasm of uncertain behavior of skin: Secondary | ICD-10-CM | POA: Diagnosis not present

## 2019-05-06 DIAGNOSIS — L570 Actinic keratosis: Secondary | ICD-10-CM | POA: Diagnosis not present

## 2019-05-06 DIAGNOSIS — C44219 Basal cell carcinoma of skin of left ear and external auricular canal: Secondary | ICD-10-CM | POA: Diagnosis not present

## 2019-05-06 DIAGNOSIS — D485 Neoplasm of uncertain behavior of skin: Secondary | ICD-10-CM | POA: Diagnosis not present

## 2019-05-06 DIAGNOSIS — C44519 Basal cell carcinoma of skin of other part of trunk: Secondary | ICD-10-CM | POA: Diagnosis not present

## 2019-05-06 DIAGNOSIS — L905 Scar conditions and fibrosis of skin: Secondary | ICD-10-CM | POA: Diagnosis not present

## 2019-08-14 DIAGNOSIS — I10 Essential (primary) hypertension: Secondary | ICD-10-CM | POA: Diagnosis not present

## 2019-08-27 DIAGNOSIS — D1801 Hemangioma of skin and subcutaneous tissue: Secondary | ICD-10-CM | POA: Diagnosis not present

## 2019-08-27 DIAGNOSIS — Z85828 Personal history of other malignant neoplasm of skin: Secondary | ICD-10-CM | POA: Diagnosis not present

## 2019-08-27 DIAGNOSIS — L821 Other seborrheic keratosis: Secondary | ICD-10-CM | POA: Diagnosis not present

## 2019-08-27 DIAGNOSIS — H6123 Impacted cerumen, bilateral: Secondary | ICD-10-CM | POA: Diagnosis not present

## 2019-08-27 DIAGNOSIS — C44319 Basal cell carcinoma of skin of other parts of face: Secondary | ICD-10-CM | POA: Diagnosis not present

## 2019-08-27 DIAGNOSIS — L570 Actinic keratosis: Secondary | ICD-10-CM | POA: Diagnosis not present

## 2019-08-27 DIAGNOSIS — L814 Other melanin hyperpigmentation: Secondary | ICD-10-CM | POA: Diagnosis not present

## 2019-08-27 DIAGNOSIS — L905 Scar conditions and fibrosis of skin: Secondary | ICD-10-CM | POA: Diagnosis not present

## 2019-08-27 DIAGNOSIS — D225 Melanocytic nevi of trunk: Secondary | ICD-10-CM | POA: Diagnosis not present

## 2019-08-27 DIAGNOSIS — Z85820 Personal history of malignant melanoma of skin: Secondary | ICD-10-CM | POA: Diagnosis not present

## 2020-03-08 DIAGNOSIS — H6123 Impacted cerumen, bilateral: Secondary | ICD-10-CM | POA: Diagnosis not present

## 2020-04-27 DIAGNOSIS — H25013 Cortical age-related cataract, bilateral: Secondary | ICD-10-CM | POA: Diagnosis not present

## 2020-05-12 DIAGNOSIS — H2589 Other age-related cataract: Secondary | ICD-10-CM | POA: Diagnosis not present

## 2020-05-12 DIAGNOSIS — H25813 Combined forms of age-related cataract, bilateral: Secondary | ICD-10-CM | POA: Diagnosis not present

## 2020-06-11 DIAGNOSIS — H25811 Combined forms of age-related cataract, right eye: Secondary | ICD-10-CM | POA: Diagnosis not present

## 2020-07-07 DIAGNOSIS — B351 Tinea unguium: Secondary | ICD-10-CM | POA: Diagnosis not present

## 2020-07-07 DIAGNOSIS — M25775 Osteophyte, left foot: Secondary | ICD-10-CM | POA: Diagnosis not present

## 2020-07-07 DIAGNOSIS — M25774 Osteophyte, right foot: Secondary | ICD-10-CM | POA: Diagnosis not present

## 2020-07-20 DIAGNOSIS — E7800 Pure hypercholesterolemia, unspecified: Secondary | ICD-10-CM | POA: Diagnosis not present

## 2020-07-20 DIAGNOSIS — I10 Essential (primary) hypertension: Secondary | ICD-10-CM | POA: Diagnosis not present

## 2020-07-20 DIAGNOSIS — Z79899 Other long term (current) drug therapy: Secondary | ICD-10-CM | POA: Diagnosis not present

## 2020-10-14 DIAGNOSIS — H6123 Impacted cerumen, bilateral: Secondary | ICD-10-CM | POA: Diagnosis not present

## 2020-11-02 DIAGNOSIS — H2589 Other age-related cataract: Secondary | ICD-10-CM | POA: Diagnosis not present

## 2020-11-02 DIAGNOSIS — Z961 Presence of intraocular lens: Secondary | ICD-10-CM | POA: Diagnosis not present

## 2020-11-02 DIAGNOSIS — H40013 Open angle with borderline findings, low risk, bilateral: Secondary | ICD-10-CM | POA: Diagnosis not present

## 2020-11-02 DIAGNOSIS — H25812 Combined forms of age-related cataract, left eye: Secondary | ICD-10-CM | POA: Diagnosis not present

## 2021-01-04 DIAGNOSIS — H2512 Age-related nuclear cataract, left eye: Secondary | ICD-10-CM | POA: Diagnosis not present

## 2021-01-07 DIAGNOSIS — H25812 Combined forms of age-related cataract, left eye: Secondary | ICD-10-CM | POA: Diagnosis not present

## 2021-04-20 DIAGNOSIS — H6123 Impacted cerumen, bilateral: Secondary | ICD-10-CM | POA: Diagnosis not present

## 2021-07-19 DIAGNOSIS — I10 Essential (primary) hypertension: Secondary | ICD-10-CM | POA: Diagnosis not present

## 2021-07-19 DIAGNOSIS — Z79899 Other long term (current) drug therapy: Secondary | ICD-10-CM | POA: Diagnosis not present

## 2021-07-19 DIAGNOSIS — E7800 Pure hypercholesterolemia, unspecified: Secondary | ICD-10-CM | POA: Diagnosis not present

## 2021-07-19 DIAGNOSIS — Z0001 Encounter for general adult medical examination with abnormal findings: Secondary | ICD-10-CM | POA: Diagnosis not present

## 2021-11-10 DIAGNOSIS — H6123 Impacted cerumen, bilateral: Secondary | ICD-10-CM | POA: Diagnosis not present

## 2021-12-01 DIAGNOSIS — H2589 Other age-related cataract: Secondary | ICD-10-CM | POA: Diagnosis not present

## 2021-12-01 DIAGNOSIS — H40013 Open angle with borderline findings, low risk, bilateral: Secondary | ICD-10-CM | POA: Diagnosis not present

## 2021-12-01 DIAGNOSIS — Z961 Presence of intraocular lens: Secondary | ICD-10-CM | POA: Diagnosis not present

## 2022-05-25 DIAGNOSIS — H6123 Impacted cerumen, bilateral: Secondary | ICD-10-CM | POA: Diagnosis not present

## 2022-10-05 ENCOUNTER — Ambulatory Visit: Payer: Medicare PPO | Admitting: Podiatry

## 2022-10-05 DIAGNOSIS — Q828 Other specified congenital malformations of skin: Secondary | ICD-10-CM | POA: Diagnosis not present

## 2022-10-05 DIAGNOSIS — M79674 Pain in right toe(s): Secondary | ICD-10-CM | POA: Diagnosis not present

## 2022-10-05 DIAGNOSIS — B351 Tinea unguium: Secondary | ICD-10-CM

## 2022-10-05 DIAGNOSIS — M79675 Pain in left toe(s): Secondary | ICD-10-CM

## 2022-10-05 NOTE — Progress Notes (Signed)
Subjective:   Patient ID: Pamela Owens, female   DOB: 82 y.o.   MRN: [id number]   HPI Chief Complaint  Patient presents with   Foot Problem    Thick nails, bilateral feet, nail trim requested    Callouses    ball of feet, bilateral feet    82 year old female presents the office today with her son for concerns of very thick elongated nails particular the hallux nails which are causing discomfort with the left side worse than the right.  She has not seen any drainage or pus.  She also gets calluses to the balls of her feet which cause discomfort.  No open lesions that she reports.    Previously saw Dr. Gershon Mussel and was told the big toenails would likely come off.    Review of Systems  All other systems reviewed and are negative.  Past Medical History:  Diagnosis Date   Empyema lung ([place]) 2006   rt chest-resp failure-on vent-rt vats   No pertinent past medical history    Skin cancer     Past Surgical History:  Procedure Laterality Date   BRONCHOSCOPY     CHEST TUBE INSERTION  2006   lt chest tube-empyema-   INCISION AND DRAINAGE OF WOUND  02/07/2012   Procedure: IRRIGATION AND DEBRIDEMENT WOUND;  Surgeon: Theodoro Kos, DO;  Location: [place];  Service: Plastics;  Laterality: N/A;  of the back with acell and vac   NO PAST SURGERIES     SKIN SPLIT GRAFT  02/29/2012   Procedure: SKIN GRAFT SPLIT THICKNESS;  Surgeon: Theodoro Kos, DO;  Location: [place];  Service: Plastics;  Laterality: N/A;  back split thickness skin graft with acell and vac and right thigh     Current Outpatient Medications:    carbamide peroxide (DEBROX) 6.5 % otic solution, Place 5 drops into both ears 2 (two) times daily., Disp: 15 mL, Rfl: 0   HYDROcodone-acetaminophen (NORCO) 5-325 MG per tablet, , Disp: , Rfl:   No Known Allergies        Objective:  Physical Exam  General: AAO x3, NAD  Dermatological: Hyperkeratotic lesions right sub 5, left sub 3 and hallux.  No  underlying ulceration drainage or signs of infection.  The nails pressure hallux nails are significantly hypertrophic, dystrophic with yellow, brown discoloration.  There is no edema, erythema, drainage or pus or any obvious signs of infection noted today.  There are no open lesions.  Vascular: Dorsalis Pedis artery and Posterior Tibial artery pedal pulses are palpable bilateral with immedate capillary fill time.  There is no pain with calf compression, swelling, warmth, erythema.   Neruologic: Grossly intact via light touch bilateral.   Musculoskeletal: Tenderness to the toenails.     Assessment:   Symptomatic onychomycosis, hyperkeratotic lesions     Plan:  -Treatment options discussed including all alternatives, risks, and complications -Etiology of symptoms were discussed -Separate debride the hyperkeratotic lesions x 3 without any complications or bleeding.  Disease.  Debridement removed infection with help of an ulceration.  Discussed moisturizer, offloading. -In regards to the toenails we discussed nail removal versus debridement the nail.  After discussion we decided to proceed with debridement.  I sharply debrided the nails x 2 without any complications or bleeding.  The other nails appear to that minimally elongated.  If symptoms continue we will consider nail removal.  Trula Slade DPM

## 2022-10-10 DIAGNOSIS — E7800 Pure hypercholesterolemia, unspecified: Secondary | ICD-10-CM | POA: Diagnosis not present

## 2022-10-10 DIAGNOSIS — Z79899 Other long term (current) drug therapy: Secondary | ICD-10-CM | POA: Diagnosis not present

## 2022-10-10 DIAGNOSIS — Z0001 Encounter for general adult medical examination with abnormal findings: Secondary | ICD-10-CM | POA: Diagnosis not present

## 2022-10-10 DIAGNOSIS — I10 Essential (primary) hypertension: Secondary | ICD-10-CM | POA: Diagnosis not present

## 2022-12-12 DIAGNOSIS — H7291 Unspecified perforation of tympanic membrane, right ear: Secondary | ICD-10-CM | POA: Diagnosis not present

## 2022-12-12 DIAGNOSIS — H6123 Impacted cerumen, bilateral: Secondary | ICD-10-CM | POA: Diagnosis not present

## 2023-06-25 ENCOUNTER — Ambulatory Visit (INDEPENDENT_AMBULATORY_CARE_PROVIDER_SITE_OTHER): Payer: Medicare PPO | Admitting: Podiatry

## 2023-06-25 DIAGNOSIS — B351 Tinea unguium: Secondary | ICD-10-CM

## 2023-06-25 DIAGNOSIS — H6122 Impacted cerumen, left ear: Secondary | ICD-10-CM | POA: Diagnosis not present

## 2023-06-25 DIAGNOSIS — M79674 Pain in right toe(s): Secondary | ICD-10-CM

## 2023-06-25 DIAGNOSIS — M79675 Pain in left toe(s): Secondary | ICD-10-CM

## 2023-06-25 NOTE — Patient Instructions (Signed)
You can use UREA NAIL GEL on the toenails (look for around 40% urea nail gel)

## 2023-06-27 NOTE — Progress Notes (Signed)
Subjective: Chief Complaint  Patient presents with   RFC    RFC    82 year old female presents the office with concerns of thick, elongated toenails that she is not able to trim her self.  No swelling redness or drainage.  No open lesions.  No new concerns.  Objective: AAO x3, NAD DP/PT pulses palpable bilaterally, CRT less than 3 seconds Nails are hypertrophic, dystrophic, brittle, discolored, elongated 10. No surrounding redness or drainage. Tenderness nails 1-5 bilaterally. No open lesions or pre-ulcerative lesions are identified today. No pain with calf compression, swelling, warmth, erythema  Assessment: Symptomatic onychomycosis  Plan: -All treatment options discussed with the patient including all alternatives, risks, complications.  -Sharp debrided nails x 10 without any complications or bleeding. -Given the thickening and nail we discussed urea nail gel. -Daily foot inspection. -Patient encouraged to call the office with any questions, concerns, change in symptoms.   Vivi Barrack DPM

## 2023-10-16 DIAGNOSIS — E7800 Pure hypercholesterolemia, unspecified: Secondary | ICD-10-CM | POA: Diagnosis not present

## 2023-10-16 DIAGNOSIS — I10 Essential (primary) hypertension: Secondary | ICD-10-CM | POA: Diagnosis not present

## 2023-10-16 DIAGNOSIS — Z79899 Other long term (current) drug therapy: Secondary | ICD-10-CM | POA: Diagnosis not present

## 2023-10-16 DIAGNOSIS — Z0000 Encounter for general adult medical examination without abnormal findings: Secondary | ICD-10-CM | POA: Diagnosis not present

## 2024-01-22 DIAGNOSIS — H6123 Impacted cerumen, bilateral: Secondary | ICD-10-CM | POA: Diagnosis not present

## 2024-01-22 DIAGNOSIS — H7291 Unspecified perforation of tympanic membrane, right ear: Secondary | ICD-10-CM | POA: Diagnosis not present

## 2024-05-20 ENCOUNTER — Ambulatory Visit: Admitting: Podiatry

## 2024-05-20 ENCOUNTER — Ambulatory Visit (INDEPENDENT_AMBULATORY_CARE_PROVIDER_SITE_OTHER)

## 2024-05-20 ENCOUNTER — Encounter: Payer: Self-pay | Admitting: Podiatry

## 2024-05-20 DIAGNOSIS — M25472 Effusion, left ankle: Secondary | ICD-10-CM

## 2024-05-20 DIAGNOSIS — I872 Venous insufficiency (chronic) (peripheral): Secondary | ICD-10-CM | POA: Diagnosis not present

## 2024-05-20 DIAGNOSIS — T148XXA Other injury of unspecified body region, initial encounter: Secondary | ICD-10-CM | POA: Diagnosis not present

## 2024-05-20 NOTE — Progress Notes (Unsigned)
  Subjective:  Patient ID: Pamela Owens, female    DOB: Jan 10, 1941,  MRN: [id number]  Chief Complaint  Patient presents with   Foot Pain    Rm11 patient complains of bruising and swelling on left medial ankle/no pain/ 1 day     Discussed the use of AI scribe software for clinical note transcription with the patient, who gave verbal consent to proceed.  History of Present Illness Pamela Owens is an 83 year old female who presents with unexplained bruising on the left ankle and foot. She is accompanied by her son.  She noticed a bruise on the left side of her ankle and foot over the last day or so without associated pain. She is able to ambulate without difficulty. There is no recollection of recent trauma or injury to the area, and she has not changed her footwear recently. She wore an ankle support brace yesterday but had not worn it prior to that day.  She has a history of easy bruising and is currently taking a blood pressure medication and amlodipine. She denies the use of anticoagulants or aspirin. There have been no recent falls or activities that could have caused the bruising.  There is no pain upon palpation of the bruise, no recent outdoor activities, and no insect bites. She has not experienced any bleeding or open wounds in the area. She is able to walk to the nearby drugstore, approximately a quarter mile from her home, without any issues.      Objective:  There were no vitals filed for this visit.  Physical Exam CARDIOVASCULAR: Pulses in feet palpable and good. Posterior tibial pulse slightly decreased 1/4.  There is prominent veins noted.  Along the medial aspect of the ankle there is ecchymosis present. EXTREMITIES: Bruising on the inside of left ankle and foot. No tenderness on palpation of left ankle and foot.  Ankle range of motion intact. SKIN: Skin thin.  Ecchymosis noted along medial aspect of the ankle without any erythema or warmth but there is no area of skin  breakdown or any active bleeding noted.  No ulcerations.   No images are attached to the encounter.    Results RADIOLOGY Ankle X-ray: Normal osseous structures, mild arthritis in foot, no fractures (05/20/2024)   Assessment:   1. Bruise   2. Venous (peripheral) insufficiency      Plan:  Patient was evaluated and treated and all questions answered.  Assessment and Plan Assessment & Plan Left ankle and foot ecchymosis Ecchymosis likely due to minor trauma or superficial vein rupture. X-rays show no fractures.  - Etiology of symptoms were discussed.  No injuries that she reports.  This could be to do a vein issue versus contusion that she is unaware of.  There is no pain associated with this and there is no ulcerations. - Apply Ace bandage for compression.  This was applied today. - Avoid ankle brace to prevent irritation. - Elevate foot when not ambulating. - Monitor for opening, bleeding, or increased pain. - Remove compression bandage while sleeping. - Contact provider if condition worsens or new symptoms arise.   Return if symptoms worsen or fail to improve.   Donnice JONELLE Fees DPM

## 2024-08-12 ENCOUNTER — Other Ambulatory Visit: Payer: Self-pay | Admitting: Family Medicine

## 2024-08-12 ENCOUNTER — Encounter: Payer: Self-pay | Admitting: Family Medicine

## 2024-08-12 DIAGNOSIS — R079 Chest pain, unspecified: Secondary | ICD-10-CM

## 2024-08-20 ENCOUNTER — Ambulatory Visit
Admission: RE | Admit: 2024-08-20 | Discharge: 2024-08-20 | Disposition: A | Source: Ambulatory Visit | Attending: Family Medicine | Admitting: Family Medicine

## 2024-08-20 DIAGNOSIS — R079 Chest pain, unspecified: Secondary | ICD-10-CM
